# Patient Record
Sex: Female | Born: 1970 | Race: White | Hispanic: No | Marital: Single | Smoking: Never smoker
Health system: Southern US, Community
[De-identification: ages and names within clinical notes are randomized; demographics above are authoritative.]

## PROBLEM LIST (undated history)

## (undated) DIAGNOSIS — N200 Calculus of kidney: Secondary | ICD-10-CM

## (undated) DIAGNOSIS — E119 Type 2 diabetes mellitus without complications: Secondary | ICD-10-CM

## (undated) HISTORY — PX: TONSILLECTOMY: SUR1361

## (undated) HISTORY — DX: Type 2 diabetes mellitus without complications: E11.9

---

## 2012-08-23 ENCOUNTER — Emergency Department (HOSPITAL_BASED_OUTPATIENT_CLINIC_OR_DEPARTMENT_OTHER)
Admission: EM | Admit: 2012-08-23 | Discharge: 2012-08-23 | Disposition: A | Discharge status: Home / Self Care | Payer: Self-pay | Attending: Emergency Medicine | Admitting: Emergency Medicine

## 2012-08-23 ENCOUNTER — Encounter (HOSPITAL_BASED_OUTPATIENT_CLINIC_OR_DEPARTMENT_OTHER): Payer: Self-pay | Admitting: *Deleted

## 2012-08-23 ENCOUNTER — Emergency Department (HOSPITAL_BASED_OUTPATIENT_CLINIC_OR_DEPARTMENT_OTHER): Payer: Self-pay

## 2012-08-23 DIAGNOSIS — E119 Type 2 diabetes mellitus without complications: Secondary | ICD-10-CM | POA: Insufficient documentation

## 2012-08-23 DIAGNOSIS — R109 Unspecified abdominal pain: Secondary | ICD-10-CM | POA: Insufficient documentation

## 2012-08-23 DIAGNOSIS — N390 Urinary tract infection, site not specified: Secondary | ICD-10-CM | POA: Insufficient documentation

## 2012-08-23 DIAGNOSIS — N201 Calculus of ureter: Secondary | ICD-10-CM | POA: Insufficient documentation

## 2012-08-23 DIAGNOSIS — Z3202 Encounter for pregnancy test, result negative: Secondary | ICD-10-CM | POA: Insufficient documentation

## 2012-08-23 LAB — URINALYSIS, ROUTINE W REFLEX MICROSCOPIC
Ketones, ur: 40 mg/dL — AB
Nitrite: NEGATIVE
Urobilinogen, UA: 0.2 mg/dL (ref 0.0–1.0)
pH: 5 (ref 5.0–8.0)

## 2012-08-23 LAB — BASIC METABOLIC PANEL
Calcium: 9.4 mg/dL (ref 8.4–10.5)
Chloride: 96 mEq/L (ref 96–112)
Creatinine, Ser: 0.7 mg/dL (ref 0.50–1.10)
GFR calc Af Amer: >90 mL/min (ref >90)
GFR calc non Af Amer: >90 mL/min (ref >90)

## 2012-08-23 LAB — POCT I-STAT 3, VENOUS BLOOD GAS (G3P V)
Acid-base deficit: 4 mmol/L — ABNORMAL HIGH (ref 0.0–2.0)
pH, Ven: 7.373 — ABNORMAL HIGH (ref 7.250–7.300)

## 2012-08-23 LAB — URINE MICROSCOPIC-ADD ON

## 2012-08-23 LAB — GLUCOSE, CAPILLARY: Glucose-Capillary: 288 mg/dL — ABNORMAL HIGH (ref 70–99)

## 2012-08-23 MED ORDER — OXYCODONE-ACETAMINOPHEN 5-325 MG PO TABS: 2.0000 | ORAL_TABLET | ORAL | Status: DC | PRN

## 2012-08-23 MED ORDER — HYDROMORPHONE HCL PF 1 MG/ML IJ SOLN
1.0000 mg | Freq: Once | INTRAMUSCULAR | Status: AC
  Administered 2012-08-23: 1 mg via INTRAVENOUS
  Filled 2012-08-23: qty 1

## 2012-08-23 MED ORDER — ONDANSETRON HCL 4 MG/2ML IJ SOLN
4.0000 mg | Freq: Once | INTRAMUSCULAR | Status: AC
  Administered 2012-08-23: 4 mg via INTRAVENOUS
  Filled 2012-08-23: qty 2

## 2012-08-23 MED ORDER — CEPHALEXIN 500 MG PO CAPS: 500.0000 mg | ORAL_CAPSULE | Freq: Four times a day (QID) | ORAL | Status: DC

## 2012-08-23 MED ORDER — SODIUM CHLORIDE 0.9 % IV BOLUS (SEPSIS)
1000.0000 mL | Freq: Once | INTRAVENOUS | Status: AC
  Administered 2012-08-23: 1000 mL via INTRAVENOUS

## 2012-08-23 MED ORDER — MORPHINE SULFATE 4 MG/ML IJ SOLN
4.0000 mg | Freq: Once | INTRAMUSCULAR | Status: AC
  Administered 2012-08-23: 4 mg via INTRAVENOUS
  Filled 2012-08-23: qty 1

## 2012-08-23 MED ORDER — METFORMIN HCL 500 MG PO TABS: 500.0000 mg | ORAL_TABLET | Freq: Two times a day (BID) | ORAL | Status: DC

## 2012-08-23 NOTE — ED Notes (Signed)
Pt c/o right lower back pain x 4 days

## 2012-08-23 NOTE — ED Provider Notes (Signed)
History    CSN: [id number] Arrival date & time 08/23/12  1439  First MD Initiated Contact with Patient 08/23/12 1502     Chief Complaint  Patient presents with  . Back Pain   (Consider location/radiation/quality/duration/timing/severity/associated sxs/prior Treatment) HPI Comments: Pt c/o left flank paint that started a couple of days ago:pt states that ibuprofen was helping, but not nothing makes her comfort:pt denies vomiting, diarrhea or fever:no abdominal pain, dysuria or vaginal discharge:no injury:no history of stones or similar symptoms  The history is provided by the patient. No language interpreter was used.   History reviewed. No pertinent past medical history. Past Surgical History  Procedure Laterality Date  . Cesarean section    . Tonsillectomy     History reviewed. No pertinent family history. History  Substance Use Topics  . Smoking status: Never Smoker   . Smokeless tobacco: Not on file  . Alcohol Use: No   OB History   Grav Para Term Preterm Abortions TAB SAB Ect Mult Living                 Review of Systems  Constitutional: Negative.   Respiratory: Negative.   Cardiovascular: Negative.     Allergies  Review of patient's allergies indicates no known allergies.  Home Medications  No current outpatient prescriptions on file. BP 144/95  Pulse 100  Temp(Src) 98.8 F (37.1 C)  Resp 16  Ht 5\' 6"  (1.676 m)  Wt 134 lb (60.782 kg)  BMI 21.64 kg/m2  SpO2 100%  LMP 08/03/2012 Physical Exam  Nursing note and vitals reviewed. Constitutional: She is oriented to person, place, and time. She appears well-developed and well-nourished.  HENT:  Head: Normocephalic and atraumatic.  Cardiovascular: Normal rate and regular rhythm.   Pulmonary/Chest: Effort normal and breath sounds normal.  Abdominal: Soft. Bowel sounds are normal. There is no tenderness.  Musculoskeletal: Normal range of motion. She exhibits no tenderness.  Neurological: She is alert and  oriented to person, place, and time. Coordination normal.  Skin: Skin is warm and dry.  Psychiatric: She has a normal mood and affect.    ED Course  Procedures (including critical care time) Labs Reviewed  URINALYSIS, ROUTINE W REFLEX MICROSCOPIC - Abnormal; Notable for the following:    APPearance CLOUDY (*)    Specific Gravity, Urine 1.038 (*)    Glucose, UA >1000 (*)    Hgb urine dipstick MODERATE (*)    Ketones, ur 40 (*)    Leukocytes, UA MODERATE (*)    All other components within normal limits  URINE MICROSCOPIC-ADD ON - Abnormal; Notable for the following:    Squamous Epithelial / LPF FEW (*)    Bacteria, UA FEW (*)    All other components within normal limits  BASIC METABOLIC PANEL - Abnormal; Notable for the following:    Sodium 130 (*)    CO2 18 (*)    Glucose, Bld 336 (*)    All other components within normal limits  GLUCOSE, CAPILLARY - Abnormal; Notable for the following:    Glucose-Capillary 288 (*)    All other components within normal limits  POCT I-STAT 3, BLOOD GAS (G3P V) - Abnormal; Notable for the following:    pH, Ven 7.373 (*)    pCO2, Ven 36.0 (*)    pO2, Ven 54.0 (*)    Acid-base deficit 4.0 (*)    All other components within normal limits  URINE CULTURE  PREGNANCY, URINE  BLOOD GAS, VENOUS   No results  found. 1. UTI (lower urinary tract infection)   2. Ureteral stone   3. Diabetes     MDM  Pt is a newly diagnosed diabetic:pt is not in dka:pt to be sent home with metformin:pt has stone and uti:pt has not had fever:will sent urine culture:pt given oxycodone and keflex for home:pt is comfortable but requesting one more dose of pain medication before she goes  Teressa Lower, NP 08/23/12 1849

## 2012-08-23 NOTE — ED Notes (Signed)
2nd lab draw due to blood hemolyzed with IV stick-reports pain is no better

## 2012-08-24 LAB — URINE CULTURE: Colony Count: 45000

## 2012-08-24 NOTE — ED Provider Notes (Signed)
History/physical exam/procedure(s) were performed by non-physician practitioner and as supervising physician I was immediately available for consultation/collaboration. I have reviewed all notes and am in agreement with care and plan.   Adylynn Hertenstein S Chantavia Bazzle, MD 08/24/12 1245 

## 2012-08-25 ENCOUNTER — Emergency Department (HOSPITAL_BASED_OUTPATIENT_CLINIC_OR_DEPARTMENT_OTHER)
Admission: EM | Admit: 2012-08-25 | Discharge: 2012-08-25 | Disposition: A | Discharge status: Home / Self Care | Payer: Self-pay | Attending: Emergency Medicine | Admitting: Emergency Medicine

## 2012-08-25 ENCOUNTER — Telehealth (HOSPITAL_COMMUNITY): Payer: Self-pay | Admitting: Emergency Medicine

## 2012-08-25 ENCOUNTER — Encounter (HOSPITAL_BASED_OUTPATIENT_CLINIC_OR_DEPARTMENT_OTHER): Payer: Self-pay | Admitting: *Deleted

## 2012-08-25 DIAGNOSIS — Z79899 Other long term (current) drug therapy: Secondary | ICD-10-CM | POA: Insufficient documentation

## 2012-08-25 DIAGNOSIS — R112 Nausea with vomiting, unspecified: Secondary | ICD-10-CM | POA: Insufficient documentation

## 2012-08-25 DIAGNOSIS — E1169 Type 2 diabetes mellitus with other specified complication: Secondary | ICD-10-CM | POA: Insufficient documentation

## 2012-08-25 DIAGNOSIS — R109 Unspecified abdominal pain: Secondary | ICD-10-CM | POA: Insufficient documentation

## 2012-08-25 DIAGNOSIS — N23 Unspecified renal colic: Secondary | ICD-10-CM

## 2012-08-25 DIAGNOSIS — Z87442 Personal history of urinary calculi: Secondary | ICD-10-CM | POA: Insufficient documentation

## 2012-08-25 DIAGNOSIS — R1110 Vomiting, unspecified: Secondary | ICD-10-CM

## 2012-08-25 DIAGNOSIS — R739 Hyperglycemia, unspecified: Secondary | ICD-10-CM

## 2012-08-25 HISTORY — DX: Calculus of kidney: N20.0

## 2012-08-25 LAB — COMPREHENSIVE METABOLIC PANEL
ALT: 17 U/L (ref 0–35)
Albumin: 3.5 g/dL (ref 3.5–5.2)
Alkaline Phosphatase: 46 U/L (ref 39–117)
BUN: 14 mg/dL (ref 6–23)
Chloride: 94 mEq/L — ABNORMAL LOW (ref 96–112)
Glucose, Bld: 281 mg/dL — ABNORMAL HIGH (ref 70–99)
Potassium: 3.8 mEq/L (ref 3.5–5.1)
Sodium: 131 mEq/L — ABNORMAL LOW (ref 135–145)
Total Bilirubin: 0.4 mg/dL (ref 0.3–1.2)
Total Protein: 7.7 g/dL (ref 6.0–8.3)

## 2012-08-25 LAB — CBC WITH DIFFERENTIAL/PLATELET
Basophils Relative: 0 % (ref 0–1)
Hemoglobin: 14.2 g/dL (ref 12.0–15.0)
Lymphs Abs: 1.7 10*3/uL (ref 0.7–4.0)
Monocytes Relative: 6 % (ref 3–12)
Neutro Abs: 7.6 10*3/uL (ref 1.7–7.7)
Neutrophils Relative %: 76 % (ref 43–77)
Platelets: 203 10*3/uL (ref 150–400)
RBC: 4.69 MIL/uL (ref 3.87–5.11)
WBC: 10.1 10*3/uL (ref 4.0–10.5)

## 2012-08-25 LAB — GLUCOSE, CAPILLARY: Glucose-Capillary: 244 mg/dL — ABNORMAL HIGH (ref 70–99)

## 2012-08-25 LAB — URINALYSIS, ROUTINE W REFLEX MICROSCOPIC
Bilirubin Urine: NEGATIVE
Hgb urine dipstick: NEGATIVE
Ketones, ur: >80 mg/dL — AB
Nitrite: NEGATIVE
Specific Gravity, Urine: 1.041 — ABNORMAL HIGH (ref 1.005–1.030)
pH: 5.5 (ref 5.0–8.0)

## 2012-08-25 LAB — LIPASE, BLOOD: Lipase: 35 U/L (ref 11–59)

## 2012-08-25 LAB — URINE MICROSCOPIC-ADD ON

## 2012-08-25 MED ORDER — METOCLOPRAMIDE HCL 5 MG/ML IJ SOLN
10.0000 mg | Freq: Once | INTRAMUSCULAR | Status: AC
  Administered 2012-08-25: 10 mg via INTRAVENOUS
  Filled 2012-08-25: qty 2

## 2012-08-25 MED ORDER — HYDROMORPHONE HCL PF 1 MG/ML IJ SOLN
1.0000 mg | Freq: Once | INTRAMUSCULAR | Status: AC
  Administered 2012-08-25: 1 mg via INTRAVENOUS
  Filled 2012-08-25: qty 1

## 2012-08-25 MED ORDER — ONDANSETRON HCL 4 MG/2ML IJ SOLN
4.0000 mg | Freq: Once | INTRAMUSCULAR | Status: AC
  Administered 2012-08-25: 4 mg via INTRAVENOUS
  Filled 2012-08-25: qty 2

## 2012-08-25 MED ORDER — OXYCODONE-ACETAMINOPHEN 5-325 MG PO TABS: 2.0000 | ORAL_TABLET | Freq: Four times a day (QID) | ORAL | Status: DC | PRN

## 2012-08-25 MED ORDER — HYDROMORPHONE HCL PF 1 MG/ML IJ SOLN
1.0000 mg | Freq: Once | INTRAMUSCULAR | Status: AC
Start: 2012-08-25 — End: 2012-08-25
  Administered 2012-08-25: 1 mg via INTRAVENOUS
  Filled 2012-08-25: qty 1

## 2012-08-25 MED ORDER — ONDANSETRON 8 MG PO TBDP: ORAL_TABLET | ORAL | Status: DC

## 2012-08-25 MED ORDER — SODIUM CHLORIDE 0.9 % IV BOLUS (SEPSIS)
2000.0000 mL | Freq: Once | INTRAVENOUS | Status: AC
  Administered 2012-08-25: 2000 mL via INTRAVENOUS

## 2012-08-25 MED ORDER — METOCLOPRAMIDE HCL 10 MG PO TABS: 10.0000 mg | ORAL_TABLET | Freq: Four times a day (QID) | ORAL | Status: DC | PRN

## 2012-08-25 NOTE — ED Notes (Signed)
The patient's CBG was 244mg /dL

## 2012-08-25 NOTE — ED Notes (Signed)
Patient here with c/o nausea and vomiting since 11pm.  Woke up around 10 am and vomited about 5 times PTA.  Patient was seen here on Friday and was diagnosed with kidney infection and UTI.

## 2012-08-25 NOTE — ED Provider Notes (Signed)
History    CSN: [id number] Arrival date & time 08/25/12  1205  First MD Initiated Contact with Patient 08/25/12 1327     Chief Complaint  Patient presents with  . Emesis   (Consider location/radiation/quality/duration/timing/severity/associated sxs/prior Treatment) HPI This 42 year old female has several days of waxing and waning right flank pain with nausea who presented to the emergency room to 2 days ago with worsening flank pain and diagnosed with kidney stone 5 mm at the UVJ as well as possible urine infection and placed on antibiotics urine culture showed polymicrobial no specific organism not convincing for urinary infection incidentally the patient had fatty liver and gallstones as well and her CT scan she also had an elevated blood sugar diagnosis new-onset diabetes and has taken 2 diabetes medicine tablets over the last couple days but is not lightheaded check blood sugars yet now returns to the emergency room and do to new-onset vomiting last night once in a few episodes today nonbloody with baseline right flank pain mild to moderate with no fever no altered mental status no headache no stiff neck no chest pain cough shortness breath no abdominal pain no diarrhea or bloody stools no dysuria and she had negative pregnancy test 2 days ago here in the ED. She has had no polyuria no polydipsia. Past Medical History  Diagnosis Date  . Kidney stone    Past Surgical History  Procedure Laterality Date  . Cesarean section    . Tonsillectomy     History reviewed. No pertinent family history. History  Substance Use Topics  . Smoking status: Never Smoker   . Smokeless tobacco: Not on file  . Alcohol Use: No   OB History   Grav Para Term Preterm Abortions TAB SAB Ect Mult Living                 Review of Systems 10 Systems reviewed and are negative for acute change except as noted in the HPI. Allergies  Review of patient's allergies indicates no known allergies.  Home  Medications   Current Outpatient Rx  Name  Route  Sig  Dispense  Refill  . cephALEXin (KEFLEX) 500 MG capsule   Oral   Take 1 capsule (500 mg total) by mouth 4 (four) times daily.   28 capsule   0   . metFORMIN (GLUCOPHAGE) 500 MG tablet   Oral   Take 1 tablet (500 mg total) by mouth 2 (two) times daily with a meal.   60 tablet   0   . metoCLOPramide (REGLAN) 10 MG tablet   Oral   Take 1 tablet (10 mg total) by mouth every 6 (six) hours as needed (nausea/headache).   10 tablet   0   . ondansetron (ZOFRAN ODT) 8 MG disintegrating tablet      8mg  ODT q4 hours prn nausea   3 tablet   0   . oxyCODONE-acetaminophen (PERCOCET) 5-325 MG per tablet   Oral   Take 2 tablets by mouth every 6 (six) hours as needed for pain.   15 tablet   0   . oxyCODONE-acetaminophen (PERCOCET/ROXICET) 5-325 MG per tablet   Oral   Take 2 tablets by mouth every 4 (four) hours as needed for pain.   20 tablet   0    BP 126/78  Pulse 87  Temp(Src) 98.4 F (36.9 C) (Oral)  Resp 20  SpO2 97%  LMP 08/03/2012 Physical Exam  Nursing note and vitals reviewed. Constitutional:  Awake, alert, nontoxic  appearance.  HENT:  Head: Atraumatic.  Mouth/Throat: Oropharynx is clear and moist.  Eyes: Right eye exhibits no discharge. Left eye exhibits no discharge.  Neck: Neck supple.  Cardiovascular: Normal rate and regular rhythm.   No murmur heard. Pulmonary/Chest: Effort normal and breath sounds normal. No respiratory distress. She has no wheezes. She has no rales. She exhibits no tenderness.  Abdominal: Soft. Bowel sounds are normal. She exhibits no distension and no mass. There is no tenderness. There is no rebound and no guarding.  Genitourinary:  No CVA tenderness no back tenderness  Musculoskeletal: She exhibits no tenderness.  Baseline ROM, no obvious new focal weakness.  Neurological: She is alert.  Mental status and motor strength appears baseline for patient and situation.  Skin: No rash  noted.  Psychiatric: She has a normal mood and affect.    ED Course  Procedures (including critical care time) Pt feels improved after observation and/or treatment in ED, pain and vomiting controlled. Labs Reviewed  COMPREHENSIVE METABOLIC PANEL - Abnormal; Notable for the following:    Sodium 131 (*)    Chloride 94 (*)    Glucose, Bld 281 (*)    All other components within normal limits  URINALYSIS, ROUTINE W REFLEX MICROSCOPIC - Abnormal; Notable for the following:    Specific Gravity, Urine 1.041 (*)    Glucose, UA >1000 (*)    Ketones, ur >80 (*)    All other components within normal limits  GLUCOSE, CAPILLARY - Abnormal; Notable for the following:    Glucose-Capillary 244 (*)    All other components within normal limits  URINE MICROSCOPIC-ADD ON - Abnormal; Notable for the following:    Squamous Epithelial / LPF FEW (*)    All other components within normal limits  CBC WITH DIFFERENTIAL  LIPASE, BLOOD  KETONES, QUALITATIVE   No results found. 1. Vomiting   2. Hyperglycemia   3. Renal colic     MDM  I doubt any other EMC precluding discharge at this time including, but not necessarily limited to the following:sepsis, DKA.  Hurman Horn, MD 08/25/12 2219

## 2012-08-28 ENCOUNTER — Ambulatory Visit: Payer: Self-pay | Attending: Family Medicine | Admitting: Internal Medicine

## 2012-08-28 VITALS — BP 137/89 | HR 82 | Temp 98.3°F | Resp 16 | Ht 66.0 in | Wt 234.4 lb

## 2012-08-28 DIAGNOSIS — Z09 Encounter for follow-up examination after completed treatment for conditions other than malignant neoplasm: Secondary | ICD-10-CM | POA: Insufficient documentation

## 2012-08-28 DIAGNOSIS — E119 Type 2 diabetes mellitus without complications: Secondary | ICD-10-CM

## 2012-08-28 NOTE — Progress Notes (Signed)
Patient ID: Erin Hawkins, female   DOB: February 23, 1971, 42 y.o.   MRN: [id number]  CC: Followup  HPI: Patient is 42 year old female who comes in for blood sugar checks. She was told she is diabetic and has no history of diabetes. She has history of gestational diabetes but that has been resolved. She denies chest pain or shortness of breath no recent sicknesses or hospitalizations, no urinary or abdominal concerns.  No Known Allergies Past Medical History  Diagnosis Date  . Kidney stone    Current Outpatient Prescriptions on File Prior to Visit  Medication Sig Dispense Refill  . metFORMIN (GLUCOPHAGE) 500 MG tablet Take 1 tablet (500 mg total) by mouth 2 (two) times daily with a meal.  60 tablet  0  . metoCLOPramide (REGLAN) 10 MG tablet Take 1 tablet (10 mg total) by mouth every 6 (six) hours as needed (nausea/headache).  10 tablet  0  . ondansetron (ZOFRAN ODT) 8 MG disintegrating tablet 8mg  ODT q4 hours prn nausea  3 tablet  0  . oxyCODONE-acetaminophen (PERCOCET) 5-325 MG per tablet Take 2 tablets by mouth every 6 (six) hours as needed for pain.  15 tablet  0  . oxyCODONE-acetaminophen (PERCOCET/ROXICET) 5-325 MG per tablet Take 2 tablets by mouth every 4 (four) hours as needed for pain.  20 tablet  0   No current facility-administered medications on file prior to visit.   History reviewed. No pertinent family history. History   Social History  . Marital Status: Single    Spouse Name: N/A    Number of Children: N/A  . Years of Education: N/A   Occupational History  . Not on file.   Social History Main Topics  . Smoking status: Never Smoker   . Smokeless tobacco: Not on file  . Alcohol Use: No  . Drug Use: No  . Sexually Active: No   Other Topics Concern  . Not on file   Social History Narrative  . No narrative on file    Review of Systems  Constitutional: Negative for fever, chills, diaphoresis, activity change, appetite change and fatigue.  HENT: Negative for ear pain,  nosebleeds, congestion, facial swelling, rhinorrhea, neck pain, neck stiffness and ear discharge.   Eyes: Negative for pain, discharge, redness, itching and visual disturbance.  Respiratory: Negative for cough, choking, chest tightness, shortness of breath, wheezing and stridor.   Cardiovascular: Negative for chest pain, palpitations and leg swelling.  Gastrointestinal: Negative for abdominal distention.  Genitourinary: Negative for dysuria, urgency, frequency, hematuria, flank pain, decreased urine volume, difficulty urinating and dyspareunia.  Musculoskeletal: Negative for back pain, joint swelling, arthralgias and gait problem.  Neurological: Negative for dizziness, tremors, seizures, syncope, facial asymmetry, speech difficulty, weakness, light-headedness, numbness and headaches.  Hematological: Negative for adenopathy. Does not bruise/bleed easily.  Psychiatric/Behavioral: Negative for hallucinations, behavioral problems, confusion, dysphoric mood, decreased concentration and agitation.    Objective:   Filed Vitals:   08/28/12 1328  BP: 137/89  Pulse: 82  Temp: 98.3 F (36.8 C)  Resp: 16    Physical Exam  Constitutional: Appears well-developed and well-nourished. No distress.  CVS: RRR, S1/S2 +, no murmurs, no gallops, no carotid bruit.  Pulmonary: Effort and breath sounds normal, no stridor, rhonchi, wheezes, rales.  Abdominal: Soft. BS +,  no distension, tenderness, rebound or guarding.  Skin: Skin is warm and dry. No rash noted. Not diaphoretic. No erythema. No pallor.  Psychiatric: Normal mood and affect. Behavior, judgment, thought content normal.   Lab Results  Component  Value Date   WBC 10.1 08/25/2012   HGB 14.2 08/25/2012   HCT 41.4 08/25/2012   MCV 88.3 08/25/2012   PLT 203 08/25/2012   Lab Results  Component Value Date   CREATININE 0.60 08/25/2012   BUN 14 08/25/2012   NA 131* 08/25/2012   K 3.8 08/25/2012   CL 94* 08/25/2012   CO2 24 08/25/2012    No results  found for this basename: HGBA1C   Lipid Panel  No results found for this basename: chol, trig, hdl, cholhdl, vldl, ldlcalc       Assessment and plan:   Questionable diabetes - appears to be diabetic based on sugar levels on electrolyte panel. We'll check A1c today. I advised patient to continue taking metformin 500 mg tablet twice daily. We will notify her of A1c results and let her know if she needs further readjustment in the medication. We have discussed exercise and diet recommendations along with regular foot checks. Patient verbalized understanding

## 2012-08-28 NOTE — Patient Instructions (Addendum)

## 2012-08-28 NOTE — Progress Notes (Signed)
Patient is a follow up from hospital Kidney stones

## 2012-10-02 ENCOUNTER — Encounter: Payer: Self-pay | Admitting: Internal Medicine

## 2012-10-02 ENCOUNTER — Ambulatory Visit: Payer: Self-pay | Attending: Family Medicine | Admitting: Internal Medicine

## 2012-10-02 VITALS — BP 115/80 | HR 103 | Temp 98.9°F | Resp 18 | Ht 64.0 in | Wt 229.6 lb

## 2012-10-02 DIAGNOSIS — J209 Acute bronchitis, unspecified: Secondary | ICD-10-CM | POA: Insufficient documentation

## 2012-10-02 DIAGNOSIS — E119 Type 2 diabetes mellitus without complications: Secondary | ICD-10-CM | POA: Insufficient documentation

## 2012-10-02 DIAGNOSIS — E669 Obesity, unspecified: Secondary | ICD-10-CM | POA: Insufficient documentation

## 2012-10-02 MED ORDER — FREESTYLE SYSTEM KIT: 1.0000 | PACK | Status: AC | PRN

## 2012-10-02 MED ORDER — METFORMIN HCL 850 MG PO TABS: 850.0000 mg | ORAL_TABLET | Freq: Two times a day (BID) | ORAL | Status: DC

## 2012-10-02 MED ORDER — GUAIFENESIN ER 600 MG PO TB12: 600.0000 mg | ORAL_TABLET | Freq: Two times a day (BID) | ORAL | Status: DC

## 2012-10-02 MED ORDER — AMOXICILLIN-POT CLAVULANATE 875-125 MG PO TABS: 1.0000 | ORAL_TABLET | Freq: Two times a day (BID) | ORAL | Status: DC

## 2012-10-02 MED ORDER — LEVALBUTEROL TARTRATE 45 MCG/ACT IN AERO: 1.0000 | INHALATION_SPRAY | Freq: Three times a day (TID) | RESPIRATORY_TRACT | Status: DC | PRN

## 2012-10-02 NOTE — Progress Notes (Signed)
10/02/12 Present for follow up with diabetes and coughing P[facility] BSN [facility]

## 2012-10-02 NOTE — Progress Notes (Signed)
Patient ID: Erin Hawkins, female   DOB: 06-09-1970, 42 y.o.   MRN: [id number] Patient Demographics  Erin Hawkins, is a 42 y.o. female  ION:[id number]  [id number]  DOB - [birth year]  No chief complaint on file.       Subjective:   Erin Hawkins today is here for a follow up visit.  Patient has No headache, No chest pain, No abdominal pain - No Nausea, No new weakness tingling or numbness, doesnot check her blood sugars, spot CBG 171 and she is fasting (didnot get time to eat) Also has cough x 2 weeks with occasional wheezing at night, post nasal drip and congestion.  Objective:    Filed Vitals:   10/02/12 1152  BP: 115/80  Pulse: 103  Temp: 98.9 F (37.2 C)  TempSrc: Oral  Resp: 18  Height: 5\' 4"  (1.626 m)  Weight: 229 lb 9.6 oz (104.146 kg)  SpO2: 97%     ALLERGIES:  No Known Allergies  PAST MEDICAL HISTORY: Past Medical History  Diagnosis Date  . Kidney stone   . Diabetes mellitus without complication     MEDICATIONS AT HOME: Prior to Admission medications   Medication Sig Start Date End Date Taking? Authorizing Provider  amoxicillin-clavulanate (AUGMENTIN) 875-125 MG per tablet Take 1 tablet by mouth 2 (two) times daily. 10/02/12   Maksymilian Mabey Jenna Luo, MD  glucose monitoring kit (FREESTYLE) monitoring kit 1 each by Does not apply route as needed for other. 10/02/12   Jalia Zuniga Jenna Luo, MD  guaiFENesin (MUCINEX) 600 MG 12 hr tablet Take 1 tablet (600 mg total) by mouth 2 (two) times daily. Also available OTC 10/02/12   Kelsay Haggard Jenna Luo, MD  metFORMIN (GLUCOPHAGE) 850 MG tablet Take 1 tablet (850 mg total) by mouth 2 (two) times daily with a meal. 10/02/12   Wadsworth Skolnick Jenna Luo, MD     Exam  General appearance :Awake, alert, NAD, Speech Clear.  HEENT: Atraumatic and Normocephalic, PERLA Neck: supple, no JVD. No cervical lymphadenopathy.  Chest: scattered wheezing noticed on coughing CVS: S1 S2 regular, no murmurs.  Abdomen:obese soft, NBS, NT, ND, no gaurding, rigidity or  rebound. Extremities: no cyanosis or clubbing, B/L Lower Ext shows no edema Neurology: Awake alert, and oriented X 3, CN II-XII intact, Non focal Skin: No Rash or lesions Wounds:N/A    Data Review   Basic Metabolic Panel: No results found for this basename: NA, K, CL, CO2, GLUCOSE, BUN, CREATININE, CALCIUM, MG, PHOS,  in the last 168 hours Liver Function Tests: No results found for this basename: AST, ALT, ALKPHOS, BILITOT, PROT, ALBUMIN,  in the last 168 hours  CBC: No results found for this basename: WBC, NEUTROABS, HGB, HCT, MCV, PLT,  in the last 168 hours  ------------------------------------------------------------------------------------------------------------------ No results found for this basename: HGBA1C,  in the last 72 hours ------------------------------------------------------------------------------------------------------------------ No results found for this basename: CHOL, HDL, LDLCALC, TRIG, CHOLHDL, LDLDIRECT,  in the last 72 hours ------------------------------------------------------------------------------------------------------------------ No results found for this basename: TSH, T4TOTAL, FREET3, T3FREE, THYROIDAB,  in the last 72 hours ------------------------------------------------------------------------------------------------------------------ No results found for this basename: VITAMINB12, FOLATE, FERRITIN, TIBC, IRON, RETICCTPCT,  in the last 72 hours  Coagulation profile  No results found for this basename: INR, PROTIME,  in the last 168 hours    Assessment & Plan   Active Problems: Diabetes Mellitus: - spot CBG 171, fasting - will increase metformin to 850mg  BID, gave prescription for glucomete - patient educated to check BS twice a day, keep a log and bring to  next appt - opthal exam in last 3 months per patient, was normal - last UA in 6/14 showed no protein uria - will check HbA1C next week - ambulatory referral to diabetic education   - counseled patient to lose weight, exercise, and gave diet education  2. Acute bronchitis:  - placed on augmentin x 10 days, mucinex - Xopenex inhaler as needed  Follow-up in 2months     Rondy Krupinski M.D. 10/02/2012, 12:29 PM

## 2012-10-31 ENCOUNTER — Encounter: Payer: Self-pay | Attending: Internal Medicine | Admitting: *Deleted

## 2012-10-31 ENCOUNTER — Encounter: Payer: Self-pay | Admitting: *Deleted

## 2012-10-31 VITALS — Ht 61.0 in | Wt 228.6 lb

## 2012-10-31 DIAGNOSIS — E119 Type 2 diabetes mellitus without complications: Secondary | ICD-10-CM | POA: Insufficient documentation

## 2012-10-31 DIAGNOSIS — Z713 Dietary counseling and surveillance: Secondary | ICD-10-CM | POA: Insufficient documentation

## 2012-10-31 NOTE — Progress Notes (Signed)
Appt start time: 0945 end time:  1115.   Assessment:  Patient was seen on  10/31/12 for individual Gestational diabetes education. She presents with her adoptive mother. She is a single mother of one daughter and did not have GDM during that pregnancy. On 08/25/12 she presented to the emergency department with pain, nausea and vomitting. On 08-28-12 she was seen by primary care with an A1c of 9.1% and was placed on Metformin. The metformin dose has since been increased to 850mg  BID. She finds that her eating behavior is challanged as she stays awake most of the night caring for her grandmother.  Current HbA1c: 9.1%  MEDICATIONS: Metformin 850mg  BID   DIETARY INTAKE:  Usual eating pattern includes 2 meals and 3 snacks per day.   Everyday foods include wide variety of proteins and vegetables.  Avoided foods include.  "Like variety"  24-hr recall:  B ( AM): potatoe, onions, pepper, sausage, egg fried in pan / poached egg on toast/ french toast Snk ( AM): peanuts, avacado, tomato, (olive oil) tortilla chips  L ( PM): subway BLT, tuna fish salade with olives Snk ( PM): peanuts D ( PM): meat, vegetable, baked potato, peas, butter beans Snk ( PM): pumpkin seeds Beverages: flavored water, skim milk   Usual physical activity: none (has [employer] but does not attend)    Nutritional Diagnosis:  NB-1.1 Food and nutrition-related knowledge deficit As related to elevated glucose.  As evidenced by A1c 9.1%.    Intervention:  Nutrition counseling provided.  Discussed diabetes disease process and treatment options.  Discussed physiology of diabetes.  Discussed role of medications and diet in glucose control  Provided education on macronutrients on glucose levels.  Provided education on carb counting, importance of regularly scheduled meals/snacks, and meal planning  Reviewed patient medications.  Discussed role of medication on blood glucose and possible side effects  Discussed blood glucose  monitoring and interpretation.  Discussed recommended target ranges and individual ranges.    Described short-term complications: hyper- and hypo-glycemia.  Discussed causes,symptoms, and treatment options.  Discussed role of stress on blood glucose levels and discussed strategies to manage psychosocial issues.  Handouts given during visit include: GDM curriculum outline Carb Counting  Meal Plan Card  Meal work sheet  Plan:  Aid for 2 carb choices for breakfast (30 grams)  Aim for 3 Carb Choices per lunch and dinner (45 grams) +/- 1 either way  Aim for 0-2 Carbs per snack if hungry  Consider reading food labels for Total Carbohydrate and Fat Grams of foods Consider checking BG four times per day FBS, 2hpp breakfast, lunch and dinner Consider taking medication as directed by MD  Barriers to learning/adherance to lifestyle change: motivation  Diabetes self-care support plan:   [facility] support group  family  Monitoring/Evaluation:  Dietary intake, exercise, glucose testing, and body weight return to [facility] prn.

## 2012-11-04 ENCOUNTER — Encounter: Payer: Self-pay | Admitting: *Deleted

## 2012-11-04 NOTE — Patient Instructions (Signed)
Plan:  Aid for 2 carb choices for breakfast (30 grams)  Aim for 3 Carb Choices per lunch and dinner (45 grams) +/- 1 either way  Aim for 0-2 Carbs per snack if hungry  Consider reading food labels for Total Carbohydrate and Fat Grams of foods Consider checking BG four times per day FBS, 2hpp breakfast, lunch and dinner Consider taking medication as directed by MD

## 2012-12-02 ENCOUNTER — Ambulatory Visit: Payer: Self-pay | Attending: Internal Medicine

## 2012-12-02 ENCOUNTER — Encounter: Payer: Self-pay | Admitting: Internal Medicine

## 2012-12-02 ENCOUNTER — Ambulatory Visit (HOSPITAL_BASED_OUTPATIENT_CLINIC_OR_DEPARTMENT_OTHER): Payer: Self-pay | Admitting: Internal Medicine

## 2012-12-02 VITALS — BP 103/68 | HR 90 | Temp 98.3°F | Resp 18 | Wt 224.0 lb

## 2012-12-02 DIAGNOSIS — E119 Type 2 diabetes mellitus without complications: Secondary | ICD-10-CM

## 2012-12-02 DIAGNOSIS — J209 Acute bronchitis, unspecified: Secondary | ICD-10-CM

## 2012-12-02 DIAGNOSIS — E669 Obesity, unspecified: Secondary | ICD-10-CM

## 2012-12-02 LAB — GLUCOSE, POCT (MANUAL RESULT ENTRY): POC Glucose: 151 mg/dl — AB (ref 70–99)

## 2012-12-02 MED ORDER — METFORMIN HCL 850 MG PO TABS: 850.0000 mg | ORAL_TABLET | Freq: Two times a day (BID) | ORAL | Status: DC

## 2012-12-02 NOTE — Progress Notes (Signed)
Patient ID: Erin Hawkins, female   DOB: 1970/09/24, 42 y.o.   MRN: [id number]  Patient Demographics  Erin Hawkins, is a 42 y.o. female  [id number]  [id number]  DOB - [birth year]  Chief Complaint  Patient presents with  . Follow-up        Subjective:   Erin Hawkins with History of morbid obesity, type 2 diabetes mellitus here for routine followup visit. She has no subjective complaints whatsoever. She's been checking her Accu-Cheks twice a day and maintaining a logbook, Accu-Cheks have been between 130 and 170.  Denies any subjective complaints except as above, no active headache, no chest abdominal pain at this time, not short of breath. No focal weakness which is new.    Objective:    Patient Active Problem List   Diagnosis Date Noted  . Type II type diabetes mellitus  10/02/2012  . Obesity, unspecified 10/02/2012     Filed Vitals:   12/02/12 0922  BP: 103/68  Pulse: 90  Temp: 98.3 F (36.8 C)  TempSrc: Oral  Resp: 18  Weight: 224 lb (101.606 kg)  SpO2: 99%     Exam   Awake Alert, Oriented X 3, No new F.N deficits, Normal affect [place].AT,PERRAL Supple Neck,No JVD, No cervical lymphadenopathy appriciated.  Symmetrical Chest wall movement, Good air movement bilaterally, CTAB RRR,No Gallops,Rubs or new Murmurs, No Parasternal Heave +ve B.Sounds, Abd Soft, Non tender, No organomegaly appriciated, No rebound - guarding or rigidity. No Cyanosis, Clubbing or edema, No new Rash or bruise       Data Review   CBC No results found for this basename: WBC, HGB, HCT, PLT, MCV, MCH, MCHC, RDW, NEUTRABS, LYMPHSABS, MONOABS, EOSABS, BASOSABS, BANDABS, BANDSABD,  in the last 168 hours  Chemistries   No results found for this basename: NA, K, CL, CO2, GLUCOSE, BUN, CREATININE, GFRCGP, CALCIUM, MG, AST, ALT, ALKPHOS, BILITOT,  in the last 168 hours ------------------------------------------------------------------------------------------------------------------ No results  found for this basename: HGBA1C,  in the last 72 hours ------------------------------------------------------------------------------------------------------------------ No results found for this basename: CHOL, HDL, LDLCALC, TRIG, CHOLHDL, LDLDIRECT,  in the last 72 hours ------------------------------------------------------------------------------------------------------------------ No results found for this basename: TSH, T4TOTAL, FREET3, T3FREE, THYROIDAB,  in the last 72 hours ------------------------------------------------------------------------------------------------------------------ No results found for this basename: VITAMINB12, FOLATE, FERRITIN, TIBC, IRON, RETICCTPCT,  in the last 72 hours  Coagulation profile  No results found for this basename: INR, PROTIME,  in the last 168 hours     Prior to Admission medications   Medication Sig Start Date End Date Taking? Authorizing Provider  glucose monitoring kit (FREESTYLE) monitoring kit 1 each by Does not apply route as needed for other. 10/02/12  Yes Ripudeep Jenna Luo, MD  levalbuterol (XOPENEX HFA) 45 MCG/ACT inhaler Inhale 1 puff into the lungs every 8 (eight) hours as needed for wheezing or shortness of breath. 10/02/12  Yes Ripudeep Jenna Luo, MD  metFORMIN (GLUCOPHAGE) 850 MG tablet Take 1 tablet (850 mg total) by mouth 2 (two) times daily with a meal. 10/02/12  Yes Ripudeep Jenna Luo, MD     Assessment & Plan    Diabetes mellitus type 2. Control is acceptable, her CBG in the office was 150 an A1c was 6. Her present dose Glucophage will be continued, she is maintaining an Accu-Chek log book and she was encouraged to continue doing so. Her recent eye exam was unremarkable. Her foot exam is unremarkable. Blood pressure is acceptable.  She will come back in 3 months to get a repeat A1c and to  get a urine microalbumin checked.   Obesity. Counseled on diet and exercise. Diabetic nutritionist consult will be made.    Routine health  maintenance.   OB referral made for mammogram and Pap smear  He should refused flu shot. She is up-to-date on tetanus.   Leroy Sea M.D on 12/02/2012 at 9:28 AM

## 2012-12-02 NOTE — Progress Notes (Signed)
Pt is here for a f/u on [facility] no new concerns Alert w/no signs of acute distress.  

## 2012-12-05 ENCOUNTER — Encounter: Payer: Self-pay | Attending: Internal Medicine | Admitting: *Deleted

## 2012-12-05 VITALS — Ht 61.0 in | Wt 224.9 lb

## 2012-12-05 DIAGNOSIS — Z713 Dietary counseling and surveillance: Secondary | ICD-10-CM | POA: Insufficient documentation

## 2012-12-05 DIAGNOSIS — E119 Type 2 diabetes mellitus without complications: Secondary | ICD-10-CM | POA: Insufficient documentation

## 2012-12-05 NOTE — Progress Notes (Deleted)
Subjective:     Patient ID: Erin Hawkins, female   DOB: [birth year], 42 y.o.   MRN: [id number]  HPI   Review of Systems     Objective:   Physical Exam     Assessment:     ***    Plan:     ***

## 2012-12-05 NOTE — Progress Notes (Signed)
Appt start time: 0900 end time:  1000.  Assessment:  Patient was seen on  12/05/12 for individual diabetes education. Patient comes in with her mother because she is noticing a significant difference in her glucose readings between different meters. They range between 142mg /dl to 171mg /dl with the same blood specimen. She will now utilize an [employer]. . Her physician recently reviewed her readings and was alarmed at their value. However, with a current A1c of 6.0% no medication change was made.  Amendment: Previous visit It was noted that patient had GDM. Patient is NOT pregnant.   A1c: 6.0%  Intervention:  Nutrition counseling provided.  Discussed diabetes disease process and treatment options.  Discussed physiology of diabetes and role of obesity on insulin resistance.  Encouraged moderate weight reduction to improve glucose levels.  Discussed role of medications and diet in glucose control  Provided education on macronutrients on glucose levels.  Provided education on carb counting, importance of regularly scheduled meals/snacks, and meal planning  Discussed effects of physical activity on glucose levels and long-term glucose control.  Recommended 150 minutes of physical activity/week.  Reviewed patient medications.  Discussed role of medication on blood glucose and possible side effects  Discussed blood glucose monitoring and interpretation.  Discussed recommended target ranges and individual ranges.    PLAN: She will continue to monitor 1X daily at varying times of day to include FBS and 2hpp Email me in one week with new glucose readings  Monitoring/Evaluation:  Dietary intake, exercise, Glucose monitoring, and body weight return to [facility] in 6 week(s).

## 2013-01-16 ENCOUNTER — Encounter: Payer: Self-pay | Admitting: *Deleted

## 2013-01-16 ENCOUNTER — Other Ambulatory Visit: Payer: Self-pay | Admitting: Emergency Medicine

## 2013-01-16 ENCOUNTER — Encounter: Payer: Self-pay | Attending: Internal Medicine | Admitting: *Deleted

## 2013-01-16 VITALS — Ht 61.0 in | Wt 222.1 lb

## 2013-01-16 DIAGNOSIS — E119 Type 2 diabetes mellitus without complications: Secondary | ICD-10-CM | POA: Insufficient documentation

## 2013-01-16 DIAGNOSIS — Z713 Dietary counseling and surveillance: Secondary | ICD-10-CM | POA: Insufficient documentation

## 2013-01-16 MED ORDER — METFORMIN HCL 850 MG PO TABS: 850.0000 mg | ORAL_TABLET | Freq: Two times a day (BID) | ORAL | Status: DC

## 2013-01-16 NOTE — Progress Notes (Signed)
Appt start time: 0900 end time:  1000.  Assessment:  Patient was seen on  01/16/13 for individual diabetes education. Tamella returns for follow up of T2DM along with her T2DM mother. She has been accompanying her mother to [facility]. Jordon used to skip meals, is now trying to eat more regularly.  She is making better choices and decreasing portion control. She has been trying recipes with alternative ingrediates to make healthier.  Glucerna had been suggested by the [facility] dietitian. In review of Tameaka's  glucose readings, Fasting range for 14 days 114-164 with a 14 day average of 137mg /dl. 14 day 2hpp  122mg /dl. Wt loss of 14.5# since September.  [employer].  [id number] Exp: [id number]  Current HbA1c: New one due in January  Preferred Learning Style:   No preference indicated   Learning Readiness:   Change in progress  MEDICATIONS: Metformin 850mg  BID  DIETARY INTAKE: No longer drinking any type of soda  Usual physical activity: None, not interested in exercise. Discussed alternative exercise options   Intervention:  Nutrition counseling provided.  Discussed diabetes disease process and treatment options.  Discussed physiology of diabetes and role of obesity on insulin resistance.  Encouraged moderate weight reduction to improve glucose levels.  Discussed role of medications and diet in glucose control  Provided education on macronutrients on glucose levels.  Provided education on carb counting, importance of regularly scheduled meals/snacks, and meal planning  Discussed effects of physical activity on glucose levels and long-term glucose control.  Recommended 150 minutes of physical activity/week.  Reviewed patient medications.  Discussed role of medication on blood glucose and possible side effects  Discussed blood glucose monitoring and interpretation.  Discussed recommended target ranges and individual ranges.     Teaching Method Utilized:  Auditory  Barriers to learning/adherence to lifestyle change: none  Diabetes self-care support plan:   [facility] support group  family  Demonstrated degree of understanding via:  Teach Back   Monitoring/Evaluation:  Dietary intake, exercise, Glucose monitoring, and body weight return prn.

## 2013-01-22 ENCOUNTER — Encounter: Payer: Self-pay | Admitting: Obstetrics and Gynecology

## 2013-03-04 ENCOUNTER — Encounter: Payer: Self-pay | Admitting: Internal Medicine

## 2013-03-04 ENCOUNTER — Ambulatory Visit: Payer: Self-pay | Attending: Internal Medicine | Admitting: Internal Medicine

## 2013-03-04 VITALS — BP 120/81 | HR 102 | Temp 98.7°F | Resp 14 | Ht 63.0 in | Wt 222.6 lb

## 2013-03-04 DIAGNOSIS — E119 Type 2 diabetes mellitus without complications: Secondary | ICD-10-CM | POA: Insufficient documentation

## 2013-03-04 LAB — LIPID PANEL
Cholesterol: 293 mg/dL — ABNORMAL HIGH (ref 0–200)
HDL: 52 mg/dL (ref >39)
LDL Cholesterol: 207 mg/dL — ABNORMAL HIGH (ref 0–99)
TRIGLYCERIDES: 171 mg/dL — AB (ref <150)
Total CHOL/HDL Ratio: 5.6 Ratio
VLDL: 34 mg/dL (ref 0–40)

## 2013-03-04 LAB — CMP AND LIVER
ALK PHOS: 37 U/L — AB (ref 39–117)
ALT: 15 U/L (ref 0–35)
AST: 14 U/L (ref 0–37)
Albumin: 4.4 g/dL (ref 3.5–5.2)
BILIRUBIN INDIRECT: 0.3 mg/dL (ref 0.0–0.9)
BILIRUBIN TOTAL: 0.4 mg/dL (ref 0.3–1.2)
BUN: 10 mg/dL (ref 6–23)
Bilirubin, Direct: 0.1 mg/dL (ref 0.0–0.3)
CO2: 27 mEq/L (ref 19–32)
Calcium: 9.5 mg/dL (ref 8.4–10.5)
Chloride: 100 mEq/L (ref 96–112)
Creat: 0.47 mg/dL — ABNORMAL LOW (ref 0.50–1.10)
GLUCOSE: 135 mg/dL — AB (ref 70–99)
Potassium: 4.2 mEq/L (ref 3.5–5.3)
Sodium: 138 mEq/L (ref 135–145)
Total Protein: 7.5 g/dL (ref 6.0–8.3)

## 2013-03-04 LAB — POCT GLYCOSYLATED HEMOGLOBIN (HGB A1C): Hemoglobin A1C: 6.2

## 2013-03-04 LAB — TSH: TSH: 1.813 u[IU]/mL (ref 0.350–4.500)

## 2013-03-04 MED ORDER — METFORMIN HCL 850 MG PO TABS: 850.0000 mg | ORAL_TABLET | Freq: Two times a day (BID) | ORAL | Status: DC

## 2013-03-04 NOTE — Progress Notes (Signed)
Pt is here for a f/u and diabetes check up.

## 2013-03-04 NOTE — Patient Instructions (Signed)

## 2013-03-04 NOTE — Progress Notes (Signed)
Patient ID: Erin Hawkins, female   DOB: 12/06/1970, 43 y.o.   MRN: [id number] Patient Demographics  Erin Hawkins, is a 43 y.o. female  [id number]  [id number]  DOB - Nov 03, 1970  Chief Complaint  Patient presents with  . Follow-up    3 month        Subjective:   Erin Hawkins is a 43 y.o. female here today for a follow up visit. Patient is here for followup of diabetes mellitus. Last hemoglobin A1c 3 months ago was 6%, she claimed to be doing well at home, no episode of hypoglycemia, no ER visit, blood sugar range is between 95 and 175. She has no complaints today. She needs a refill of her medications. She does not drink alcohol, she does not smoke cigarette. She is due for lab draw today and hemoglobin A1c. Patient has No headache, No chest pain, No abdominal pain - No Nausea, No new weakness tingling or numbness, No Cough - SOB.  ALLERGIES: No Known Allergies  PAST MEDICAL HISTORY: Past Medical History  Diagnosis Date  . Kidney stone   . Diabetes mellitus without complication     MEDICATIONS AT HOME: Prior to Admission medications   Medication Sig Start Date End Date Taking? Authorizing Provider  metFORMIN (GLUCOPHAGE) 850 MG tablet Take 1 tablet (850 mg total) by mouth 2 (two) times daily with a meal. 03/04/13  Yes Angelica Chessman, MD  glucose monitoring kit (FREESTYLE) monitoring kit 1 each by Does not apply route as needed for other. 10/02/12   Ripudeep Krystal Eaton, MD  levalbuterol (XOPENEX HFA) 45 MCG/ACT inhaler Inhale 1 puff into the lungs every 8 (eight) hours as needed for wheezing or shortness of breath. 10/02/12   Ripudeep Krystal Eaton, MD     Objective:   Filed Vitals:   03/04/13 0900  BP: 120/81  Pulse: 102  Temp: 98.7 F (37.1 C)  TempSrc: Oral  Resp: 14  Height: 5' 3"  (1.6 m)  Weight: 222 lb 9.6 oz (100.971 kg)  SpO2: 97%    Exam General appearance : Awake, alert, not in any distress. Speech Clear. Not toxic looking, obese HEENT: Atraumatic and Normocephalic,  pupils equally reactive to light and accomodation Neck: supple, no JVD. No cervical lymphadenopathy.  Chest:Good air entry bilaterally, no added sounds  CVS: S1 S2 regular, no murmurs.  Abdomen: Bowel sounds present, Non tender and not distended with no gaurding, rigidity or rebound. Extremities: B/L Lower Ext shows no edema, both legs are warm to touch Neurology: Awake alert, and oriented X 3, CN II-XII intact, Non focal Skin:No Rash Wounds:N/A   Data Review   CBC No results found for this basename: WBC, HGB, HCT, PLT, MCV, MCH, MCHC, RDW, NEUTRABS, LYMPHSABS, MONOABS, EOSABS, BASOSABS, BANDABS, BANDSABD,  in the last 168 hours  Chemistries   No results found for this basename: NA, K, CL, CO2, GLUCOSE, BUN, CREATININE, GFRCGP, CALCIUM, MG, AST, ALT, ALKPHOS, BILITOT,  in the last 168 hours ------------------------------------------------------------------------------------------------------------------  Recent Labs  03/04/13 0918  HGBA1C 6.2   ------------------------------------------------------------------------------------------------------------------ No results found for this basename: CHOL, HDL, LDLCALC, TRIG, CHOLHDL, LDLDIRECT,  in the last 72 hours ------------------------------------------------------------------------------------------------------------------ No results found for this basename: TSH, T4TOTAL, FREET3, T3FREE, THYROIDAB,  in the last 72 hours ------------------------------------------------------------------------------------------------------------------ No results found for this basename: VITAMINB12, FOLATE, FERRITIN, TIBC, IRON, RETICCTPCT,  in the last 72 hours  Coagulation profile  No results found for this basename: INR, PROTIME,  in the last 168 hours    Assessment & Plan  1. Diabetes  - HgB A1c is 6.2% today  2. DM2 (diabetes mellitus, type 2) Refill - metFORMIN (GLUCOPHAGE) 850 MG tablet; Take 1 tablet (850 mg total) by mouth 2 (two)  times daily with a meal.  Dispense: 180 tablet; Refill: 3  - CMP and Liver - TSH - Lipid panel  Patient was extensively counseled about nutrition and exercise  Follow up in 3 months or when necessary  The patient was given clear instructions to go to ER or return to medical center if symptoms don't improve, worsen or new problems develop. The patient verbalized understanding. The patient was told to call to get lab results if they haven't heard anything in the next week.    Angelica Chessman, MD, [place], [place], [place] and [place], [place]   03/04/2013, 9:36 AM

## 2013-03-28 ENCOUNTER — Telehealth: Payer: Self-pay | Admitting: *Deleted

## 2013-03-28 DIAGNOSIS — E7800 Pure hypercholesterolemia, unspecified: Secondary | ICD-10-CM

## 2013-03-28 MED ORDER — ATORVASTATIN CALCIUM 20 MG PO TABS: 20.0000 mg | ORAL_TABLET | Freq: Every day | ORAL | Status: DC

## 2013-03-28 NOTE — Telephone Encounter (Signed)
I called the pt and informed her that her blood sugar was high and her cholesterol level was very high. I encourage her to adhere with low carbohydrate low cholesterol low fat diet as well as regular physical exercise at least 3 times a week 30 minutes each time. I informed her that I was going to prescribe Lipitor to take every day.

## 2013-03-28 NOTE — Telephone Encounter (Signed)
Message copied by Raynelle CharyWINFREE, Arick Mareno R on Fri Mar 28, 2013  9:47 AM ------      Message from: Jeanann LewandowskyJEGEDE, OLUGBEMIGA E      Created: Wed Mar 26, 2013 10:36 AM       Please inform patient that her laboratory test results are mostly normal except for high blood sugar and very high cholesterol level. We will need to start her on cholesterol medication, we also encourage her to adhere with low carbohydrate low cholesterol low fat diet as well as regular physical exercise at least 3 times a week 30 minutes each time.            Please call in Lipitor 20 mg tablet by mouth daily, 90 tablets with 3 refills ------

## 2013-06-02 ENCOUNTER — Encounter: Payer: Self-pay | Admitting: Internal Medicine

## 2013-06-02 ENCOUNTER — Ambulatory Visit: Payer: Self-pay | Attending: Internal Medicine | Admitting: Internal Medicine

## 2013-06-02 VITALS — BP 122/79 | HR 103 | Temp 99.5°F | Resp 16 | Ht 64.0 in | Wt 220.0 lb

## 2013-06-02 DIAGNOSIS — Z09 Encounter for follow-up examination after completed treatment for conditions other than malignant neoplasm: Secondary | ICD-10-CM | POA: Insufficient documentation

## 2013-06-02 DIAGNOSIS — E785 Hyperlipidemia, unspecified: Secondary | ICD-10-CM | POA: Insufficient documentation

## 2013-06-02 DIAGNOSIS — E119 Type 2 diabetes mellitus without complications: Secondary | ICD-10-CM

## 2013-06-02 DIAGNOSIS — R509 Fever, unspecified: Secondary | ICD-10-CM

## 2013-06-02 LAB — CBC WITH DIFFERENTIAL/PLATELET
BASOS ABS: 0 10*3/uL (ref 0.0–0.1)
BASOS PCT: 0 % (ref 0–1)
EOS ABS: 0 10*3/uL (ref 0.0–0.7)
EOS PCT: 0 % (ref 0–5)
HCT: 33.6 % — ABNORMAL LOW (ref 36.0–46.0)
Hemoglobin: 11.7 g/dL — ABNORMAL LOW (ref 12.0–15.0)
LYMPHS PCT: 7 % — AB (ref 12–46)
Lymphs Abs: 1.3 10*3/uL (ref 0.7–4.0)
MCH: 28.8 pg (ref 26.0–34.0)
MCHC: 34.8 g/dL (ref 30.0–36.0)
MCV: 82.8 fL (ref 78.0–100.0)
Monocytes Absolute: 1.3 10*3/uL — ABNORMAL HIGH (ref 0.1–1.0)
Monocytes Relative: 7 % (ref 3–12)
Neutro Abs: 15.4 10*3/uL — ABNORMAL HIGH (ref 1.7–7.7)
Neutrophils Relative %: 86 % — ABNORMAL HIGH (ref 43–77)
PLATELETS: 301 10*3/uL (ref 150–400)
RBC: 4.06 MIL/uL (ref 3.87–5.11)
RDW: 12.8 % (ref 11.5–15.5)
WBC: 17.9 10*3/uL — AB (ref 4.0–10.5)

## 2013-06-02 LAB — COMPLETE METABOLIC PANEL WITH GFR
ALBUMIN: 3.5 g/dL (ref 3.5–5.2)
ALT: 51 U/L — ABNORMAL HIGH (ref 0–35)
AST: 25 U/L (ref 0–37)
Alkaline Phosphatase: 55 U/L (ref 39–117)
BUN: 7 mg/dL (ref 6–23)
CALCIUM: 8.9 mg/dL (ref 8.4–10.5)
CHLORIDE: 93 meq/L — AB (ref 96–112)
CO2: 24 mEq/L (ref 19–32)
Creat: 0.64 mg/dL (ref 0.50–1.10)
GFR, Est African American: >89 mL/min
GFR, Est Non African American: >89 mL/min
GLUCOSE: 253 mg/dL — AB (ref 70–99)
POTASSIUM: 3.6 meq/L (ref 3.5–5.3)
Sodium: 135 mEq/L (ref 135–145)
TOTAL PROTEIN: 6.9 g/dL (ref 6.0–8.3)
Total Bilirubin: 0.5 mg/dL (ref 0.2–1.2)

## 2013-06-02 LAB — GLUCOSE, POCT (MANUAL RESULT ENTRY): POC GLUCOSE: 255 mg/dL — AB (ref 70–99)

## 2013-06-02 LAB — POCT GLYCOSYLATED HEMOGLOBIN (HGB A1C): Hemoglobin A1C: 6.8

## 2013-06-02 MED ORDER — METFORMIN HCL 1000 MG PO TABS: 1000.0000 mg | ORAL_TABLET | Freq: Two times a day (BID) | ORAL | Status: AC

## 2013-06-02 NOTE — Progress Notes (Signed)
Pt is here today following up on her diabetes. Pt states that she has flu like symptoms for a week now.

## 2013-06-02 NOTE — Patient Instructions (Signed)
Fat and Cholesterol Control Diet Fat and cholesterol levels in your blood and organs are influenced by your diet. High levels of fat and cholesterol may lead to diseases of the heart, small and large blood vessels, gallbladder, liver, and pancreas. CONTROLLING FAT AND CHOLESTEROL WITH DIET Although exercise and lifestyle factors are important, your diet is key. That is because certain foods are known to raise cholesterol and others to lower it. The goal is to balance foods for their effect on cholesterol and more importantly, to replace saturated and trans fat with other types of fat, such as monounsaturated fat, polyunsaturated fat, and omega-3 fatty acids. On average, a person should consume no more than 15 to 17 g of saturated fat daily. Saturated and trans fats are considered "bad" fats, and they will raise LDL cholesterol. Saturated fats are primarily found in animal products such as meats, butter, and cream. However, that does not mean you need to give up all your favorite foods. Today, there are good tasting, low-fat, low-cholesterol substitutes for most of the things you like to eat. Choose low-fat or nonfat alternatives. Choose round or loin cuts of red meat. These types of cuts are lowest in fat and cholesterol. Chicken (without the skin), fish, veal, and ground turkey breast are great choices. Eliminate fatty meats, such as hot dogs and salami. Even shellfish have little or no saturated fat. Have a 3 oz (85 g) portion when you eat lean meat, poultry, or fish. Trans fats are also called "partially hydrogenated oils." They are oils that have been scientifically manipulated so that they are solid at room temperature resulting in a longer shelf life and improved taste and texture of foods in which they are added. Trans fats are found in stick margarine, some tub margarines, cookies, crackers, and baked goods.  When baking and cooking, oils are a great substitute for butter. The monounsaturated oils are  especially beneficial since it is believed they lower LDL and raise HDL. The oils you should avoid entirely are saturated tropical oils, such as coconut and palm.  Remember to eat a lot from food groups that are naturally free of saturated and trans fat, including fish, fruit, vegetables, beans, grains (barley, rice, couscous, bulgur wheat), and pasta (without cream sauces).  IDENTIFYING FOODS THAT LOWER FAT AND CHOLESTEROL  Soluble fiber may lower your cholesterol. This type of fiber is found in fruits such as apples, vegetables such as broccoli, potatoes, and carrots, legumes such as beans, peas, and lentils, and grains such as barley. Foods fortified with plant sterols (phytosterol) may also lower cholesterol. You should eat at least 2 g per day of these foods for a cholesterol lowering effect.  Read package labels to identify low-saturated fats, trans fat free, and low-fat foods at the supermarket. Select cheeses that have only 2 to 3 g saturated fat per ounce. Use a heart-healthy tub margarine that is free of trans fats or partially hydrogenated oil. When buying baked goods (cookies, crackers), avoid partially hydrogenated oils. Breads and muffins should be made from whole grains (whole-wheat or whole oat flour, instead of "flour" or "enriched flour"). Buy non-creamy canned soups with reduced salt and no added fats.  FOOD PREPARATION TECHNIQUES  Never deep-fry. If you must fry, either stir-fry, which uses very little fat, or use non-stick cooking sprays. When possible, broil, bake, or roast meats, and steam vegetables. Instead of putting butter or margarine on vegetables, use lemon and herbs, applesauce, and cinnamon (for squash and sweet potatoes). Use nonfat   yogurt, salsa, and low-fat dressings for salads.  LOW-SATURATED FAT / LOW-FAT FOOD SUBSTITUTES Meats / Saturated Fat (g)  Avoid: Steak, marbled (3 oz/85 g) / 11 g  Choose: Steak, lean (3 oz/85 g) / 4 g  Avoid: Hamburger (3 oz/85 g) / 7  g  Choose: Hamburger, lean (3 oz/85 g) / 5 g  Avoid: Ham (3 oz/85 g) / 6 g  Choose: Ham, lean cut (3 oz/85 g) / 2.4 g  Avoid: Chicken, with skin, dark meat (3 oz/85 g) / 4 g  Choose: Chicken, skin removed, dark meat (3 oz/85 g) / 2 g  Avoid: Chicken, with skin, light meat (3 oz/85 g) / 2.5 g  Choose: Chicken, skin removed, light meat (3 oz/85 g) / 1 g Dairy / Saturated Fat (g)  Avoid: Whole milk (1 cup) / 5 g  Choose: Low-fat milk, 2% (1 cup) / 3 g  Choose: Low-fat milk, 1% (1 cup) / 1.5 g  Choose: Skim milk (1 cup) / 0.3 g  Avoid: Hard cheese (1 oz/28 g) / 6 g  Choose: Skim milk cheese (1 oz/28 g) / 2 to 3 g  Avoid: Cottage cheese, 4% fat (1 cup) / 6.5 g  Choose: Low-fat cottage cheese, 1% fat (1 cup) / 1.5 g  Avoid: Ice cream (1 cup) / 9 g  Choose: Sherbet (1 cup) / 2.5 g  Choose: Nonfat frozen yogurt (1 cup) / 0.3 g  Choose: Frozen fruit bar / trace  Avoid: Whipped cream (1 tbs) / 3.5 g  Choose: Nondairy whipped topping (1 tbs) / 1 g Condiments / Saturated Fat (g)  Avoid: Mayonnaise (1 tbs) / 2 g  Choose: Low-fat mayonnaise (1 tbs) / 1 g  Avoid: Butter (1 tbs) / 7 g  Choose: Extra light margarine (1 tbs) / 1 g  Avoid: Coconut oil (1 tbs) / 11.8 g  Choose: Olive oil (1 tbs) / 1.8 g  Choose: Corn oil (1 tbs) / 1.7 g  Choose: Safflower oil (1 tbs) / 1.2 g  Choose: Sunflower oil (1 tbs) / 1.4 g  Choose: Soybean oil (1 tbs) / 2.4 g  Choose: Canola oil (1 tbs) / 1 g Document Released: 02/13/2005 Document Revised: 06/10/2012 Document Reviewed: 08/04/2010 [employer] Patient Information 2014 [place], [place]. Hypertriglyceridemia  Diet for High blood levels of Triglycerides Most fats in food are triglycerides. Triglycerides in your blood are stored as fat in your body. High levels of triglycerides in your blood may put you at a greater risk for heart disease and stroke.  Normal triglyceride levels are less than 150 mg/dL. Borderline high levels are  150-199 mg/dl. High levels are 200 - 499 mg/dL, and very high triglyceride levels are greater than 500 mg/dL. The decision to treat high triglycerides is generally based on the level. For people with borderline or high triglyceride levels, treatment includes weight loss and exercise. Drugs are recommended for people with very high triglyceride levels. Many people who need treatment for high triglyceride levels have metabolic syndrome. This syndrome is a collection of disorders that often include: insulin resistance, high blood pressure, blood clotting problems, high cholesterol and triglycerides. TESTING PROCEDURE FOR TRIGLYCERIDES  You should not eat 4 hours before getting your triglycerides measured. The normal range of triglycerides is between 10 and 250 milligrams per deciliter (mg/dl). Some people may have extreme levels (1000 or above), but your triglyceride level may be too high if it is above 150 mg/dl, depending on what other risk factors you have  for heart disease.  People with high blood triglycerides may also have high blood cholesterol levels. If you have high blood cholesterol as well as high blood triglycerides, your risk for heart disease is probably greater than if you only had high triglycerides. High blood cholesterol is one of the main risk factors for heart disease. CHANGING YOUR DIET  Your weight can affect your blood triglyceride level. If you are more than 20% above your ideal body weight, you may be able to lower your blood triglycerides by losing weight. Eating less and exercising regularly is the best way to combat this. Fat provides more calories than any other food. The best way to lose weight is to eat less fat. Only 30% of your total calories should come from fat. Less than 7% of your diet should come from saturated fat. A diet low in fat and saturated fat is the same as a diet to decrease blood cholesterol. By eating a diet lower in fat, you may lose weight, lower your blood  cholesterol, and lower your blood triglyceride level.  Eating a diet low in fat, especially saturated fat, may also help you lower your blood triglyceride level. Ask your dietitian to help you figure how much fat you can eat based on the number of calories your caregiver has prescribed for you.  Exercise, in addition to helping with weight loss may also help lower triglyceride levels.   Alcohol can increase blood triglycerides. You may need to stop drinking alcoholic beverages.  Too much carbohydrate in your diet may also increase your blood triglycerides. Some complex carbohydrates are necessary in your diet. These may include bread, rice, potatoes, other starchy vegetables and cereals.  Reduce "simple" carbohydrates. These may include pure sugars, candy, honey, and jelly without losing other nutrients. If you have the kind of high blood triglycerides that is affected by the amount of carbohydrates in your diet, you will need to eat less sugar and less high-sugar foods. Your caregiver can help you with this.  Adding 2-4 grams of fish oil (EPA+ DHA) may also help lower triglycerides. Speak with your caregiver before adding any supplements to your regimen. Following the Diet  Maintain your ideal weight. Your caregivers can help you with a diet. Generally, eating less food and getting more exercise will help you lose weight. Joining a weight control group may also help. Ask your caregivers for a good weight control group in your area.  Eat low-fat foods instead of high-fat foods. This can help you lose weight too.  These foods are lower in fat. Eat MORE of these:   Dried beans, peas, and lentils.  Egg whites.  Low-fat cottage cheese.  Fish.  Lean cuts of meat, such as round, sirloin, rump, and flank (cut extra fat off meat you fix).  Whole grain breads, cereals and pasta.  Skim and nonfat dry milk.  Low-fat yogurt.  Poultry without the skin.  Cheese made with skim or part-skim milk,  such as mozzarella, parmesan, farmers', ricotta, or pot cheese. These are higher fat foods. Eat LESS of these:   Whole milk and foods made from whole milk, such as American, blue, cheddar, monterey jack, and swiss cheese  High-fat meats, such as luncheon meats, sausages, knockwurst, bratwurst, hot dogs, ribs, corned beef, ground pork, and regular ground beef.  Fried foods. Limit saturated fats in your diet. Substituting unsaturated fat for saturated fat may decrease your blood triglyceride level. You will need to read package labels to know which products  contain saturated fats.  These foods are high in saturated fat. Eat LESS of these:   Fried pork skins.  Whole milk.  Skin and fat from poultry.  Palm oil.  Butter.  Shortening.  Cream cheese.  Tomasa Blase.  Margarines and baked goods made from listed oils.  Vegetable shortenings.  Chitterlings.  Fat from meats.  Coconut oil.  Palm kernel oil.  Lard.  Cream.  Sour cream.  Fatback.  Coffee whiteners and non-dairy creamers made with these oils.  Cheese made from whole milk. Use unsaturated fats (both polyunsaturated and monounsaturated) moderately. Remember, even though unsaturated fats are better than saturated fats; you still want a diet low in total fat.  These foods are high in unsaturated fat:   Canola oil.  Sunflower oil.  Mayonnaise.  Almonds.  Peanuts.  Pine nuts.  Margarines made with these oils.  Safflower oil.  Olive oil.  Avocados.  Cashews.  Peanut butter.  Sunflower seeds.  Soybean oil.  Peanut oil.  Olives.  Pecans.  Walnuts.  Pumpkin seeds. Avoid sugar and other high-sugar foods. This will decrease carbohydrates without decreasing other nutrients. Sugar in your food goes rapidly to your blood. When there is excess sugar in your blood, your liver may use it to make more triglycerides. Sugar also contains calories without other important nutrients.  Eat LESS of these:    Sugar, brown sugar, powdered sugar, jam, jelly, preserves, honey, syrup, molasses, pies, candy, cakes, cookies, frosting, pastries, colas, soft drinks, punches, fruit drinks, and regular gelatin.  Avoid alcohol. Alcohol, even more than sugar, may increase blood triglycerides. In addition, alcohol is high in calories and low in nutrients. Ask for sparkling water, or a diet soft drink instead of an alcoholic beverage. Suggestions for planning and preparing meals   Bake, broil, grill or roast meats instead of frying.  Remove fat from meats and skin from poultry before cooking.  Add spices, herbs, lemon juice or vinegar to vegetables instead of salt, rich sauces or gravies.  Use a non-stick skillet without fat or use no-stick sprays.  Cool and refrigerate stews and broth. Then remove the hardened fat floating on the surface before serving.  Refrigerate meat drippings and skim off fat to make low-fat gravies.  Serve more fish.  Use less butter, margarine and other high-fat spreads on bread or vegetables.  Use skim or reconstituted non-fat dry milk for cooking.  Cook with low-fat cheeses.  Substitute low-fat yogurt or cottage cheese for all or part of the sour cream in recipes for sauces, dips or congealed salads.  Use half yogurt/half mayonnaise in salad recipes.  Substitute evaporated skim milk for cream. Evaporated skim milk or reconstituted non-fat dry milk can be whipped and substituted for whipped cream in certain recipes.  Choose fresh fruits for dessert instead of high-fat foods such as pies or cakes. Fruits are naturally low in fat. When Dining Out   Order low-fat appetizers such as fruit or vegetable juice, pasta with vegetables or tomato sauce.  Select clear, rather than cream soups.  Ask that dressings and gravies be served on the side. Then use less of them.  Order foods that are baked, broiled, poached, steamed, stir-fried, or roasted.  Ask for margarine instead  of butter, and use only a small amount.  Drink sparkling water, unsweetened tea or coffee, or diet soft drinks instead of alcohol or other sweet beverages. QUESTIONS AND ANSWERS ABOUT OTHER FATS IN THE BLOOD: SATURATED FAT, TRANS FAT, AND CHOLESTEROL What is trans  fat? Trans fat is a type of fat that is formed when vegetable oil is hardened through a process called hydrogenation. This process helps makes foods more solid, gives them shape, and prolongs their shelf life. Trans fats are also called hydrogenated or partially hydrogenated oils.  What do saturated fat, trans fat, and cholesterol in foods have to do with heart disease? Saturated fat, trans fat, and cholesterol in the diet all raise the level of LDL "bad" cholesterol in the blood. The higher the LDL cholesterol, the greater the risk for coronary heart disease (CHD). Saturated fat and trans fat raise LDL similarly.  What foods contain saturated fat, trans fat, and cholesterol? High amounts of saturated fat are found in animal products, such as fatty cuts of meat, chicken skin, and full-fat dairy products like butter, whole milk, cream, and cheese, and in tropical vegetable oils such as palm, palm kernel, and coconut oil. Trans fat is found in some of the same foods as saturated fat, such as vegetable shortening, some margarines (especially hard or stick margarine), crackers, cookies, baked goods, fried foods, salad dressings, and other processed foods made with partially hydrogenated vegetable oils. Small amounts of trans fat also occur naturally in some animal products, such as milk products, beef, and lamb. Foods high in cholesterol include liver, other organ meats, egg yolks, shrimp, and full-fat dairy products. How can I use the new food label to make heart-healthy food choices? Check the Nutrition Facts panel of the food label. Choose foods lower in saturated fat, trans fat, and cholesterol. For saturated fat and cholesterol, you can also  use the Percent Daily Value (%DV): 5% DV or less is low, and 20% DV or more is high. (There is no %DV for trans fat.) Use the Nutrition Facts panel to choose foods low in saturated fat and cholesterol, and if the trans fat is not listed, read the ingredients and limit products that list shortening or hydrogenated or partially hydrogenated vegetable oil, which tend to be high in trans fat. POINTS TO REMEMBER:   Discuss your risk for heart disease with your caregivers, and take steps to reduce risk factors.  Change your diet. Choose foods that are low in saturated fat, trans fat, and cholesterol.  Add exercise to your daily routine if it is not already being done. Participate in physical activity of moderate intensity, like brisk walking, for at least 30 minutes on most, and preferably all days of the week. No time? Break the 30 minutes into three, 10-minute segments during the day.  Stop smoking. If you do smoke, contact your caregiver to discuss ways in which they can help you quit.  Do not use street drugs.  Maintain a normal weight.  Maintain a healthy blood pressure.  Keep up with your blood work for checking the fats in your blood as directed by your caregiver. Document Released: 12/02/2003 Document Revised: 08/15/2011 Document Reviewed: 06/29/2008 [facility] Patient Information 2014 [place], [place]. Diabetes Meal Planning Guide The diabetes meal planning guide is a tool to help you plan your meals and snacks. It is important for people with diabetes to manage their blood glucose (sugar) levels. Choosing the right foods and the right amounts throughout your day will help control your blood glucose. Eating right can even help you improve your blood pressure and reach or maintain a healthy weight. CARBOHYDRATE COUNTING MADE EASY When you eat carbohydrates, they turn to sugar. This raises your blood glucose level. Counting carbohydrates can help you control this level  so you feel better. When  you plan your meals by counting carbohydrates, you can have more flexibility in what you eat and balance your medicine with your food intake. Carbohydrate counting simply means adding up the total amount of carbohydrate grams in your meals and snacks. Try to eat about the same amount at each meal. Foods with carbohydrates are listed below. Each portion below is 1 carbohydrate serving or 15 grams of carbohydrates. Ask your dietician how many grams of carbohydrates you should eat at each meal or snack. Grains and Starches  1 slice bread.   English muffin or hotdog/hamburger bun.   cup cold cereal (unsweetened).   cup cooked pasta or rice.   cup starchy vegetables (corn, potatoes, peas, beans, winter squash).  1 tortilla (6 inches).   bagel.  1 waffle or pancake (size of a CD).   cup cooked cereal.  4 to 6 small crackers. *Whole grain is recommended. Fruit  1 cup fresh unsweetened berries, melon, papaya, pineapple.  1 small fresh fruit.   banana or mango.   cup fruit juice (4 oz unsweetened).   cup canned fruit in natural juice or water.  2 tbs dried fruit.  12 to 15 grapes or cherries. Milk and Yogurt  1 cup fat-free or 1% milk.  1 cup soy milk.  6 oz light yogurt with sugar-free sweetener.  6 oz low-fat soy yogurt.  6 oz plain yogurt. Vegetables  1 cup raw or  cup cooked is counted as 0 carbohydrates or a "free" food.  If you eat 3 or more servings at 1 meal, count them as 1 carbohydrate serving. Other Carbohydrates   oz chips or pretzels.   cup ice cream or frozen yogurt.   cup sherbet or sorbet.  2 inch square cake, no frosting.  1 tbs honey, sugar, jam, jelly, or syrup.  2 small cookies.  3 squares of graham crackers.  3 cups popcorn.  6 crackers.  1 cup broth-based soup.  Count 1 cup casserole or other mixed foods as 2 carbohydrate servings.  Foods with less than 20 calories in a serving may be counted as 0 carbohydrates or  a "free" food. You may want to purchase a book or computer software that lists the carbohydrate gram counts of different foods. In addition, the nutrition facts panel on the labels of the foods you eat are a good source of this information. The label will tell you how big the serving size is and the total number of carbohydrate grams you will be eating per serving. Divide this number by 15 to obtain the number of carbohydrate servings in a portion. Remember, 1 carbohydrate serving equals 15 grams of carbohydrate. SERVING SIZES Measuring foods and serving sizes helps you make sure you are getting the right amount of food. The list below tells how big or small some common serving sizes are.  1 oz.........4 stacked dice.  3 oz........Marland KitchenDeck of cards.  1 tsp.......Marland KitchenTip of little finger.  1 tbs......Marland KitchenMarland KitchenThumb.  2 tbs.......Marland KitchenGolf ball.   cup......Marland KitchenHalf of a fist.  1 cup.......Marland KitchenA fist. SAMPLE DIABETES MEAL PLAN Below is a sample meal plan that includes foods from the grain and starches, dairy, vegetable, fruit, and meat groups. A dietician can individualize a meal plan to fit your calorie needs and tell you the number of servings needed from each food group. However, controlling the total amount of carbohydrates in your meal or snack is more important than making sure you include all of the food groups  at every meal. You may interchange carbohydrate containing foods (dairy, starches, and fruits). The meal plan below is an example of a 2000 calorie diet using carbohydrate counting. This meal plan has 17 carbohydrate servings. Breakfast  1 cup oatmeal (2 carb servings).   cup light yogurt (1 carb serving).  1 cup blueberries (1 carb serving).   cup almonds. Snack  1 large apple (2 carb servings).  1 low-fat string cheese stick. Lunch  Chicken breast salad.  1 cup spinach.   cup chopped tomatoes.  2 oz chicken breast, sliced.  2 tbs low-fat [place] dressing.  12 whole-wheat  crackers (2 carb servings).  12 to 15 grapes (1 carb serving).  1 cup low-fat milk (1 carb serving). Snack  1 cup carrots.   cup hummus (1 carb serving). Dinner  3 oz broiled salmon.  1 cup brown rice (3 carb servings). Snack  1  cups steamed broccoli (1 carb serving) drizzled with 1 tsp olive oil and lemon juice.  1 cup light pudding (2 carb servings). DIABETES MEAL PLANNING WORKSHEET Your dietician can use this worksheet to help you decide how many servings of foods and what types of foods are right for you.  BREAKFAST Food Group and Servings / Carb Servings Grain/Starches __________________________________ Dairy __________________________________________ Vegetable ______________________________________ Fruit ___________________________________________ Meat __________________________________________ Fat ____________________________________________ LUNCH Food Group and Servings / Carb Servings Grain/Starches ___________________________________ Dairy ___________________________________________ Fruit ____________________________________________ Meat ___________________________________________ Fat _____________________________________________ Laural Golden Food Group and Servings / Carb Servings Grain/Starches ___________________________________ Dairy ___________________________________________ Fruit ____________________________________________ Meat ___________________________________________ Fat _____________________________________________ SNACKS Food Group and Servings / Carb Servings Grain/Starches ___________________________________ Dairy ___________________________________________ Vegetable _______________________________________ Fruit ____________________________________________ Meat ___________________________________________ Fat _____________________________________________ DAILY TOTALS Starches _________________________ Vegetable  ________________________ Fruit ____________________________ Dairy ____________________________ Meat ____________________________ Fat ______________________________ Document Released: 11/10/2004 Document Revised: 05/08/2011 Document Reviewed: 09/21/2008 [employer] Patient Information 2014 [place].

## 2013-06-02 NOTE — Progress Notes (Signed)
Patient ID: Erin Hawkins, female   DOB: [birth year], 43 y.o.   MRN: [id number]   Erin Hawkins, is a 43 y.o. female  [id number]  [id number]  DOB - [birth year]  Chief Complaint  Patient presents with  . Follow-up        Subjective:   Erin Hawkins is a 43 y.o. female here today for a follow up visit. Patient is known to have diabetes on metformin 1000 mg tablet by mouth twice a day and dyslipidemia on atorvastatin 20 mg tablet by mouth daily. Patient claims to have stopped taking atorvastatin because of side effects which include pains in her legs due to muscle breakdown. She has also been sick for about a week now with high fever up to 102F. about a week ago she attended the funeral ceremony of her grandmother in [place] and since then she has been feeling very weak, fever and headache. Headache is global on tension-like, worse during the day, no relations to light, aggravated by moving the head sideways, relieved by ibuprofen. No nausea or vomiting. Fever is intermittent with occasional chills. No change in bowel habit, no urinary symptoms, patient has runny nose slight dry cough, no chest pain. No sore throat. No joint swelling. No night sweat except when she has chills/rigors. She has no history of contact with patient for the same symptoms. Patient has No chest pain, No abdominal pain - No Nausea, No new weakness tingling or numbness, No SOB.  Problem  Fever, Unspecified    ALLERGIES: No Known Allergies  PAST MEDICAL HISTORY: Past Medical History  Diagnosis Date  . Kidney stone   . Diabetes mellitus without complication     MEDICATIONS AT HOME: Prior to Admission medications   Medication Sig Start Date End Date Taking? Authorizing Provider  glucose monitoring kit (FREESTYLE) monitoring kit 1 each by Does not apply route as needed for other. 10/02/12  Yes Ripudeep Krystal Eaton, MD  metFORMIN (GLUCOPHAGE) 1000 MG tablet Take 1 tablet (1,000 mg total) by mouth 2 (two) times daily with a  meal. 06/02/13  Yes Angelica Chessman, MD  atorvastatin (LIPITOR) 20 MG tablet Take 1 tablet (20 mg total) by mouth daily. 03/28/13   Angelica Chessman, MD  levalbuterol (XOPENEX HFA) 45 MCG/ACT inhaler Inhale 1 puff into the lungs every 8 (eight) hours as needed for wheezing or shortness of breath. 10/02/12   Ripudeep Krystal Eaton, MD     Objective:   Filed Vitals:   06/02/13 0928  BP: 122/79  Pulse: 103  Temp: 99.5 F (37.5 C)  TempSrc: Oral  Resp: 16  Height: _0  (1.626 m)  Weight: 220 lb (99.791 kg)  SpO2: 96%    Exam General appearance : Awake, alert, not in any distress. Speech Clear. Not toxic looking, morbidly obese HEENT: Atraumatic and Normocephalic, pupils equally reactive to light and accomodation Neck: supple, no JVD. No cervical lymphadenopathy.  Chest:Good air entry bilaterally, no added sounds  CVS: Mild tachycardia S1 S2 regular, no murmurs.  Abdomen: Bowel sounds present, Non tender and not distended with no gaurding, rigidity or rebound. Extremities: B/L Lower Ext shows no edema, both legs are warm to touch Neurology: Awake alert, and oriented X 3, CN II-XII intact, Non focal Skin:No Rash Wounds:N/A  Data Review Lab Results  Component Value Date   HGBA1C 6.8 06/02/2013   HGBA1C 6.2 03/04/2013     Assessment & Plan   1. Diabetes  - Glucose (CBG) - HgB A1c is now 6.8% from 6.2% in January  2015, patient claims that she has not been taking her medication consistently due to to illness.   2. DM2 (diabetes mellitus, type 2) Patient has been encouraged to be compliant with her medication, we're not able to make changes today because of pain consistency in her compliance. We will continue the same dose and watch for another 3 months, patient was counseled also about nutrition and exercise. A diabetic diet outline has been given to patient. She has also been encouraged on low carbohydrate low fat, low cholesterol diet with physical exercise regularly  - metFORMIN  (GLUCOPHAGE) 1000 MG tablet; Take 1 tablet (1,000 mg total) by mouth 2 (two) times daily with a meal.  Dispense: 180 tablet; Refill: 3  3. Fever, unspecified, most likely viral Management will depend on findings from lab result Tylenol when necessary for fever - COMPLETE METABOLIC PANEL WITH GFR - CBC with Differential - Urinalysis, Complete  Patient was counseled extensively about nutrition and exercise Return if symptoms worsen or fail to improve, for Hemoglobin A1C and Follow up, DM.  The patient was given clear instructions to go to ER or return to medical center if symptoms don't improve, worsen or new problems develop. The patient verbalized understanding. The patient was told to call to get lab results if they haven't heard anything in the next week.   This note has been created with Surveyor, quantity. Any transcriptional errors are unintentional.    Angelica Chessman, MD, [place], [place], [place] and [place], [place]   06/02/2013, 10:11 AM

## 2013-06-03 LAB — URINALYSIS, COMPLETE
Casts: NONE SEEN
Crystals: NONE SEEN
Glucose, UA: 500 mg/dL — AB
Leukocytes, UA: NEGATIVE
NITRITE: NEGATIVE
PROTEIN: 100 mg/dL — AB
Specific Gravity, Urine: 1.013 (ref 1.005–1.030)
UROBILINOGEN UA: 0.2 mg/dL (ref 0.0–1.0)
pH: 6 (ref 5.0–8.0)

## 2013-06-04 ENCOUNTER — Telehealth: Payer: Self-pay

## 2013-06-04 ENCOUNTER — Telehealth: Payer: Self-pay | Admitting: Emergency Medicine

## 2013-06-04 MED ORDER — AMOXICILLIN-POT CLAVULANATE 875-125 MG PO TABS: 1.0000 | ORAL_TABLET | Freq: Two times a day (BID) | ORAL | Status: DC

## 2013-06-04 NOTE — Telephone Encounter (Signed)
Left message for pt to call for lab results with medication instructions 

## 2013-06-04 NOTE — Telephone Encounter (Signed)
Patient not available Left message on voice mail to return call 

## 2013-06-04 NOTE — Telephone Encounter (Signed)
Message copied by Lestine MountJUAREZ, Imaad Reuss L on Wed Jun 04, 2013  3:10 PM ------      Message from: Jeanann LewandowskyJEGEDE, OLUGBEMIGA E      Created: Wed Jun 04, 2013  2:45 PM       Please inform patient that her laboratory tests results show possibility of infection with a high white cell count. She also have slightly low hemoglobin level compared to 9 months ago, her blood sugar was high at the time of testing with hemoglobin A1c of 6.8%. One of her liver enzymes is slightly high but of unknown significance. Because of her symptoms which include fever and high white cell count, we will like to place her on antibiotics for at least 7 days.      Please call in prescription Augmentin 875 mg tablet by mouth twice a day, 14 tablets with no refill ------

## 2013-09-01 ENCOUNTER — Encounter: Payer: Self-pay | Admitting: Internal Medicine

## 2013-09-01 ENCOUNTER — Ambulatory Visit: Payer: Self-pay | Attending: Internal Medicine | Admitting: Internal Medicine

## 2013-09-01 VITALS — BP 117/77 | HR 88 | Temp 99.7°F | Resp 14 | Ht 64.0 in | Wt 228.0 lb

## 2013-09-01 DIAGNOSIS — E119 Type 2 diabetes mellitus without complications: Secondary | ICD-10-CM | POA: Insufficient documentation

## 2013-09-01 DIAGNOSIS — E7800 Pure hypercholesterolemia, unspecified: Secondary | ICD-10-CM | POA: Insufficient documentation

## 2013-09-01 DIAGNOSIS — E669 Obesity, unspecified: Secondary | ICD-10-CM | POA: Insufficient documentation

## 2013-09-01 LAB — POCT GLYCOSYLATED HEMOGLOBIN (HGB A1C): Hemoglobin A1C: 6

## 2013-09-01 LAB — GLUCOSE, POCT (MANUAL RESULT ENTRY): POC GLUCOSE: 126 mg/dL — AB (ref 70–99)

## 2013-09-01 NOTE — Progress Notes (Signed)
Patient ID: Erin Hawkins, female   DOB: 12-04-1970, 43 y.o.   MRN: [id number]   Erin Hawkins, is a 43 y.o. female  [id number]  [id number]  DOB - [birth year]  Chief Complaint  Patient presents with  . Follow-up        Subjective:   Erin Hawkins is a 43 y.o. female here today for a follow up visit. Patient is known to have diabetes mellitus type 2 on metformin, and dyslipidemia he is on Lipitor 20 mg tablet by mouth daily but patient has not been taking it because of side effects, saying that she strictly adherent with dietary control. She has no complaint today. She is here for her regular followup and hemoglobin A1c check. Last hemoglobin A1c was 6.8% in April. She says she is feeling much much better today, no side effects to metformin reported, she has changed her diet around and now doing well with low sugar, low carbohydrate and low cholesterol diet. She does not smoke cigarette she does not drink alcohol. Patient has No headache, No chest pain, No abdominal pain - No Nausea, No new weakness tingling or numbness, No Cough - SOB.  Problem  High Cholesterol  Type 2 Diabetes Mellitus Without Complication    ALLERGIES: No Known Allergies  PAST MEDICAL HISTORY: Past Medical History  Diagnosis Date  . Kidney stone   . Diabetes mellitus without complication     MEDICATIONS AT HOME: Prior to Admission medications   Medication Sig Start Date End Date Taking? Authorizing Provider  glucose monitoring kit (FREESTYLE) monitoring kit 1 each by Does not apply route as needed for other. 10/02/12  Yes Ripudeep Krystal Eaton, MD  metFORMIN (GLUCOPHAGE) 1000 MG tablet Take 1 tablet (1,000 mg total) by mouth 2 (two) times daily with a meal. 06/02/13  Yes Angelica Chessman, MD  amoxicillin-clavulanate (AUGMENTIN) 875-125 MG per tablet Take 1 tablet by mouth 2 (two) times daily. 06/04/13   Angelica Chessman, MD  atorvastatin (LIPITOR) 20 MG tablet Take 1 tablet (20 mg total) by mouth daily. 03/28/13    Angelica Chessman, MD  levalbuterol (XOPENEX HFA) 45 MCG/ACT inhaler Inhale 1 puff into the lungs every 8 (eight) hours as needed for wheezing or shortness of breath. 10/02/12   Ripudeep Krystal Eaton, MD     Objective:   Filed Vitals:   09/01/13 0943  BP: 117/77  Pulse: 88  Temp: 99.7 F (37.6 C)  TempSrc: Oral  Resp: 14  Height: 5' 4"  (1.626 m)  Weight: 228 lb (103.42 kg)  SpO2: 99%    Exam General appearance : Awake, alert, not in any distress. Speech Clear. Not toxic looking, obese  HEENT: Atraumatic and Normocephalic, pupils equally reactive to light and accomodation Neck: supple, no JVD. No cervical lymphadenopathy.  Chest:Good air entry bilaterally, no added sounds  CVS: S1 S2 regular, no murmurs.  Abdomen: Bowel sounds present, Non tender and not distended with no gaurding, rigidity or rebound. Extremities: B/L Lower Ext shows no edema, both legs are warm to touch Neurology: Awake alert, and oriented X 3, CN II-XII intact, Non focal Skin:No Rash Wounds:N/A  Data Review Lab Results  Component Value Date   HGBA1C 6.0 09/01/2013   HGBA1C 6.8 06/02/2013   HGBA1C 6.2 03/04/2013     Assessment & Plan   1. Type 2 diabetes mellitus without complication  - Glucose (CBG) - HgB A1c is 6.0% today, better than 6.8% 3 months ago Patient was encouraged to continue metformin and nutritional control as well as regular  physical exercise  2. Obesity, unspecified Patient was counseled extensively about nutrition and exercise Lipid profile next visit  Return in about 3 months (around 12/02/2013), or if symptoms worsen or fail to improve, for Hemoglobin A1C and Follow up, DM, and Fasting .lipid profile  The patient was given clear instructions to go to ER or return to medical center if symptoms don't improve, worsen or new problems develop. The patient verbalized understanding. The patient was told to call to get lab results if they haven't heard anything in the next week.   This note has  been created with Surveyor, quantity. Any transcriptional errors are unintentional.    Angelica Chessman, MD, [place], [place], [place] and [place], [place]   09/01/2013, 10:54 AM

## 2013-09-01 NOTE — Patient Instructions (Signed)
Diabetes and Exercise Exercising regularly is important. It is not just about losing weight. It has many health benefits, such as:  Improving your overall fitness, flexibility, and endurance.  Increasing your bone density.  Helping with weight control.  Decreasing your body fat.  Increasing your muscle strength.  Reducing stress and tension.  Improving your overall health. People with diabetes who exercise gain additional benefits because exercise:  Reduces appetite.  Improves the body's use of blood sugar (glucose).  Helps lower or control blood glucose.  Decreases blood pressure.  Helps control blood lipids (such as cholesterol and triglycerides).  Improves the body's use of the hormone insulin by:  Increasing the body's insulin sensitivity.  Reducing the body's insulin needs.  Decreases the risk for heart disease because exercising:  Lowers cholesterol and triglycerides levels.  Increases the levels of good cholesterol (such as high-density lipoproteins [HDL]) in the body.  Lowers blood glucose levels. YOUR ACTIVITY PLAN  Choose an activity that you enjoy and set realistic goals. Your health care provider or diabetes educator can help you make an activity plan that works for you. You can break activities into 2 or 3 sessions throughout the day. Doing so is as good as one long session. Exercise ideas include:  Taking the dog for a walk.  Taking the stairs instead of the elevator.  Dancing to your favorite song.  Doing your favorite exercise with a friend. RECOMMENDATIONS FOR EXERCISING WITH TYPE 1 OR TYPE 2 DIABETES   Check your blood glucose before exercising. If blood glucose levels are greater than 240 mg/dL, check for urine ketones. Do not exercise if ketones are present.  Avoid injecting insulin into areas of the body that are going to be exercised. For example, avoid injecting insulin into:  The arms when playing tennis.  The legs when  jogging.  Keep a record of:  Food intake before and after you exercise.  Expected peak times of insulin action.  Blood glucose levels before and after you exercise.  The type and amount of exercise you have done.  Review your records with your health care provider. Your health care provider will help you to develop guidelines for adjusting food intake and insulin amounts before and after exercising.  If you take insulin or oral hypoglycemic agents, watch for signs and symptoms of hypoglycemia. They include:  Dizziness.  Shaking.  Sweating.  Chills.  Confusion.  Drink plenty of water while you exercise to prevent dehydration or heat stroke. Body water is lost during exercise and must be replaced.  Talk to your health care provider before starting an exercise program to make sure it is safe for you. Remember, almost any type of activity is better than none. Document Released: 05/06/2003 Document Revised: 10/16/2012 Document Reviewed: 07/23/2012 ExitCare Patient Information 2015 [employer]. This information is not intended to replace advice given to you by your health care provider. Make sure you discuss any questions you have with your health care provider. Diabetes Mellitus and Food It is important for you to manage your blood sugar (glucose) level. Your blood glucose level can be greatly affected by what you eat. Eating healthier foods in the appropriate amounts throughout the day at about the same time each day will help you control your blood glucose level. It can also help slow or prevent worsening of your diabetes mellitus. Healthy eating may even help you improve the level of your blood pressure and reach or maintain a healthy weight.  HOW CAN FOOD AFFECT   ME? Carbohydrates Carbohydrates affect your blood glucose level more than any other type of food. Your dietitian will help you determine how many carbohydrates to eat at each meal and teach you how to count  carbohydrates. Counting carbohydrates is important to keep your blood glucose at a healthy level, especially if you are using insulin or taking certain medicines for diabetes mellitus. Alcohol Alcohol can cause sudden decreases in blood glucose (hypoglycemia), especially if you use insulin or take certain medicines for diabetes mellitus. Hypoglycemia can be a life-threatening condition. Symptoms of hypoglycemia (sleepiness, dizziness, and disorientation) are similar to symptoms of having too much alcohol.  If your health care provider has given you approval to drink alcohol, do so in moderation and use the following guidelines:  Women should not have more than one drink per day, and men should not have more than two drinks per day. One drink is equal to:  12 oz of beer.  5 oz of wine.  1 oz of hard liquor.  Do not drink on an empty stomach.  Keep yourself hydrated. Have water, diet soda, or unsweetened iced tea.  Regular soda, juice, and other mixers might contain a lot of carbohydrates and should be counted. WHAT FOODS ARE NOT RECOMMENDED? As you make food choices, it is important to remember that all foods are not the same. Some foods have fewer nutrients per serving than other foods, even though they might have the same number of calories or carbohydrates. It is difficult to get your body what it needs when you eat foods with fewer nutrients. Examples of foods that you should avoid that are high in calories and carbohydrates but low in nutrients include:  Trans fats (most processed foods list trans fats on the Nutrition Facts label).  Regular soda.  Juice.  Candy.  Sweets, such as cake, pie, doughnuts, and cookies.  Fried foods. WHAT FOODS CAN I EAT? Have nutrient-rich foods, which will nourish your body and keep you healthy. The food you should eat also will depend on several factors, including:  The calories you need.  The medicines you take.  Your weight.  Your blood  glucose level.  Your blood pressure level.  Your cholesterol level. You also should eat a variety of foods, including:  Protein, such as meat, poultry, fish, tofu, nuts, and seeds (lean animal proteins are best).  Fruits.  Vegetables.  Dairy products, such as milk, cheese, and yogurt (low fat is best).  Breads, grains, pasta, cereal, rice, and beans.  Fats such as olive oil, trans fat-free margarine, canola oil, avocado, and olives. DOES EVERYONE WITH DIABETES MELLITUS HAVE THE SAME MEAL PLAN? Because every person with diabetes mellitus is different, there is not one meal plan that works for everyone. It is very important that you meet with a dietitian who will help you create a meal plan that is just right for you. Document Released: 11/10/2004 Document Revised: 02/18/2013 Document Reviewed: 01/10/2013 ExitCare Patient Information 2015 [employer]. This information is not intended to replace advice given to you by your health care provider. Make sure you discuss any questions you have with your health care provider.  

## 2013-09-01 NOTE — Progress Notes (Signed)
Pt is here following up on her diabetes. 

## 2013-12-02 ENCOUNTER — Encounter: Payer: Self-pay | Admitting: Internal Medicine

## 2013-12-02 ENCOUNTER — Ambulatory Visit: Payer: Self-pay | Attending: Internal Medicine | Admitting: Internal Medicine

## 2013-12-02 VITALS — BP 116/83 | HR 90 | Temp 98.4°F | Resp 18 | Ht 63.0 in | Wt 233.4 lb

## 2013-12-02 DIAGNOSIS — E785 Hyperlipidemia, unspecified: Secondary | ICD-10-CM | POA: Insufficient documentation

## 2013-12-02 DIAGNOSIS — E119 Type 2 diabetes mellitus without complications: Secondary | ICD-10-CM | POA: Insufficient documentation

## 2013-12-02 LAB — POCT GLYCOSYLATED HEMOGLOBIN (HGB A1C): HEMOGLOBIN A1C: 6.5

## 2013-12-02 LAB — GLUCOSE, POCT (MANUAL RESULT ENTRY): POC GLUCOSE: 173 mg/dL — AB (ref 70–99)

## 2013-12-02 MED ORDER — ATORVASTATIN CALCIUM 20 MG PO TABS: 20.0000 mg | ORAL_TABLET | Freq: Every day | ORAL | Status: AC

## 2013-12-02 NOTE — Progress Notes (Signed)
Patient ID: Erin Hawkins, female   DOB: 1970-03-02, 43 y.o.   MRN: [id number]   Erin Hawkins, is a 43 y.o. female  [id number]  [id number]  DOB - 08-05-1970  Chief Complaint  Patient presents with  . Follow-up        Subjective:   Erin Hawkins is a 43 y.o. female here today for a follow up visit. Patient is known to have diabetes mellitus type 2 on metformin and dyslipidemia on Lipitor here today for a routine visit. She also needs a refill of her cholesterol medicine. She has no complaints today. She is compliant with medications and reports no side effects. No hypoglycemic episodes. Patient has No headache, No chest pain, No abdominal pain - No Nausea, No new weakness tingling or numbness, No Cough - SOB.  No problems updated.  ALLERGIES: No Known Allergies  PAST MEDICAL HISTORY: Past Medical History  Diagnosis Date  . Kidney stone   . Diabetes mellitus without complication     MEDICATIONS AT HOME: Prior to Admission medications   Medication Sig Start Date End Date Taking? Authorizing Provider  atorvastatin (LIPITOR) 20 MG tablet Take 1 tablet (20 mg total) by mouth daily. 12/02/13   Tresa Garter, MD  glucose monitoring kit (FREESTYLE) monitoring kit 1 each by Does not apply route as needed for other. 10/02/12   Ripudeep Krystal Eaton, MD  metFORMIN (GLUCOPHAGE) 1000 MG tablet Take 1 tablet (1,000 mg total) by mouth 2 (two) times daily with a meal. 06/02/13   Tresa Garter, MD     Objective:   Filed Vitals:   12/02/13 0926  BP: 116/83  Pulse: 90  Temp: 98.4 F (36.9 C)  TempSrc: Oral  Resp: 18  Height: _0  (1.6 m)  Weight: 233 lb 6.4 oz (105.87 kg)  SpO2: 99%    Exam General appearance : Awake, alert, not in any distress. Speech Clear. Not toxic looking HEENT: Atraumatic and Normocephalic, pupils equally reactive to light and accomodation Neck: supple, no JVD. No cervical lymphadenopathy.  Chest:Good air entry bilaterally, no added sounds  CVS: S1 S2  regular, no murmurs.  Abdomen: Bowel sounds present, Non tender and not distended with no gaurding, rigidity or rebound. Extremities: B/L Lower Ext shows no edema, both legs are warm to touch Neurology: Awake alert, and oriented X 3, CN II-XII intact, Non focal Skin:No Rash Wounds:N/A  Data Review Lab Results  Component Value Date   HGBA1C 6.5 12/02/2013   HGBA1C 6.0 09/01/2013   HGBA1C 6.8 06/02/2013     Assessment & Plan   1. Type 2 diabetes mellitus without complication  - Glucose (CBG) - HgB A1c 6.5% today Continue metformin at the current dose - atorvastatin (LIPITOR) 20 MG tablet; Take 1 tablet (20 mg total) by mouth daily.  Dispense: 90 tablet; Refill: 3  Aim for 2-3 Carb Choices per meal (30-45 grams) +/- 1 either way  Aim for 0-15 Carbs per snack if hungry  Include protein in moderation with your meals and snacks  Consider reading food labels for Total Carbohydrate and Fat Grams of foods  Consider checking BG at alternate times per day  Continue taking medication as directed Fruit Punch - find one with no sugar  Measure and decrease portions of carbohydrate foods  Make your plate and don't go back for seconds   Return in about 3 months (around 03/04/2014), or if symptoms worsen or fail to improve, for Hemoglobin A1C and Follow up, DM, Lipid profile.  The patient  was given clear instructions to go to ER or return to medical center if symptoms don't improve, worsen or new problems develop. The patient verbalized understanding. The patient was told to call to get lab results if they haven't heard anything in the next week.   This note has been created with Surveyor, quantity. Any transcriptional errors are unintentional.    Angelica Chessman, MD, [place], [place], [place] and [place], [place]   12/02/2013, 10:04 AM

## 2013-12-02 NOTE — Progress Notes (Signed)
Patient here for diabetes follow up visit Patient has no complaints and complains of no pain

## 2013-12-02 NOTE — Patient Instructions (Signed)
Diabetes and Exercise Exercising regularly is important. It is not just about losing weight. It has many health benefits, such as:  Improving your overall fitness, flexibility, and endurance.  Increasing your bone density.  Helping with weight control.  Decreasing your body fat.  Increasing your muscle strength.  Reducing stress and tension.  Improving your overall health. People with diabetes who exercise gain additional benefits because exercise:  Reduces appetite.  Improves the body's use of blood sugar (glucose).  Helps lower or control blood glucose.  Decreases blood pressure.  Helps control blood lipids (such as cholesterol and triglycerides).  Improves the body's use of the hormone insulin by:  Increasing the body's insulin sensitivity.  Reducing the body's insulin needs.  Decreases the risk for heart disease because exercising:  Lowers cholesterol and triglycerides levels.  Increases the levels of good cholesterol (such as high-density lipoproteins [HDL]) in the body.  Lowers blood glucose levels. YOUR ACTIVITY PLAN  Choose an activity that you enjoy and set realistic goals. Your health care provider or diabetes educator can help you make an activity plan that works for you. Exercise regularly as directed by your health care provider. This includes:  Performing resistance training twice a week such as push-ups, sit-ups, lifting weights, or using resistance bands.  Performing 150 minutes of cardio exercises each week such as walking, running, or playing sports.  Staying active and spending no more than 90 minutes at one time being inactive. Even short bursts of exercise are good for you. Three 10-minute sessions spread throughout the day are just as beneficial as a single 30-minute session. Some exercise ideas include:  Taking the dog for a walk.  Taking the stairs instead of the elevator.  Dancing to your favorite song.  Doing an exercise  video.  Doing your favorite exercise with a friend. RECOMMENDATIONS FOR EXERCISING WITH TYPE 1 OR TYPE 2 DIABETES   Check your blood glucose before exercising. If blood glucose levels are greater than 240 mg/dL, check for urine ketones. Do not exercise if ketones are present.  Avoid injecting insulin into areas of the body that are going to be exercised. For example, avoid injecting insulin into:  The arms when playing tennis.  The legs when jogging.  Keep a record of:  Food intake before and after you exercise.  Expected peak times of insulin action.  Blood glucose levels before and after you exercise.  The type and amount of exercise you have done.  Review your records with your health care provider. Your health care provider will help you to develop guidelines for adjusting food intake and insulin amounts before and after exercising.  If you take insulin or oral hypoglycemic agents, watch for signs and symptoms of hypoglycemia. They include:  Dizziness.  Shaking.  Sweating.  Chills.  Confusion.  Drink plenty of water while you exercise to prevent dehydration or heat stroke. Body water is lost during exercise and must be replaced.  Talk to your health care provider before starting an exercise program to make sure it is safe for you. Remember, almost any type of activity is better than none. Document Released: 05/06/2003 Document Revised: 06/30/2013 Document Reviewed: 07/23/2012 [employer] Patient Information 2015 [employer]. This information is not intended to replace advice given to you by your health care provider. Make sure you discuss any questions you have with your health care provider. Diabetes Mellitus and Food It is important for you to manage your blood sugar (glucose) level. Your blood glucose level can   be greatly affected by what you eat. Eating healthier foods in the appropriate amounts throughout the day at about the same time each day will help you  control your blood glucose level. It can also help slow or prevent worsening of your diabetes mellitus. Healthy eating may even help you improve the level of your blood pressure and reach or maintain a healthy weight.  HOW CAN FOOD AFFECT ME? Carbohydrates Carbohydrates affect your blood glucose level more than any other type of food. Your dietitian will help you determine how many carbohydrates to eat at each meal and teach you how to count carbohydrates. Counting carbohydrates is important to keep your blood glucose at a healthy level, especially if you are using insulin or taking certain medicines for diabetes mellitus. Alcohol Alcohol can cause sudden decreases in blood glucose (hypoglycemia), especially if you use insulin or take certain medicines for diabetes mellitus. Hypoglycemia can be a life-threatening condition. Symptoms of hypoglycemia (sleepiness, dizziness, and disorientation) are similar to symptoms of having too much alcohol.  If your health care provider has given you approval to drink alcohol, do so in moderation and use the following guidelines:  Women should not have more than one drink per day, and men should not have more than two drinks per day. One drink is equal to:  12 oz of beer.  5 oz of wine.  1 oz of hard liquor.  Do not drink on an empty stomach.  Keep yourself hydrated. Have water, diet soda, or unsweetened iced tea.  Regular soda, juice, and other mixers might contain a lot of carbohydrates and should be counted. WHAT FOODS ARE NOT RECOMMENDED? As you make food choices, it is important to remember that all foods are not the same. Some foods have fewer nutrients per serving than other foods, even though they might have the same number of calories or carbohydrates. It is difficult to get your body what it needs when you eat foods with fewer nutrients. Examples of foods that you should avoid that are high in calories and carbohydrates but low in nutrients  include:  Trans fats (most processed foods list trans fats on the Nutrition Facts label).  Regular soda.  Juice.  Candy.  Sweets, such as cake, pie, doughnuts, and cookies.  Fried foods. WHAT FOODS CAN I EAT? Have nutrient-rich foods, which will nourish your body and keep you healthy. The food you should eat also will depend on several factors, including:  The calories you need.  The medicines you take.  Your weight.  Your blood glucose level.  Your blood pressure level.  Your cholesterol level. You also should eat a variety of foods, including:  Protein, such as meat, poultry, fish, tofu, nuts, and seeds (lean animal proteins are best).  Fruits.  Vegetables.  Dairy products, such as milk, cheese, and yogurt (low fat is best).  Breads, grains, pasta, cereal, rice, and beans.  Fats such as olive oil, trans fat-free margarine, canola oil, avocado, and olives. DOES EVERYONE WITH DIABETES MELLITUS HAVE THE SAME MEAL PLAN? Because every person with diabetes mellitus is different, there is not one meal plan that works for everyone. It is very important that you meet with a dietitian who will help you create a meal plan that is just right for you. Document Released: 11/10/2004 Document Revised: 02/18/2013 Document Reviewed: 01/10/2013 [employer] Patient Information 2015 [employer]. This information is not intended to replace advice given to you by your health care provider. Make sure you discuss any   questions you have with your health care provider.  

## 2014-04-05 IMAGING — CT CT ABD-PELV W/O CM
2 of 4 series · 16 of 46 positions shown, 18 images · non-contrast
Comparison: None.

CLINICAL DATA: Neck pain and right low back pain.

CT ABDOMEN AND PELVIS WITHOUT CONTRAST
TECHNIQUE: Multidetector CT imaging of the abdomen and pelvis was
performed following the standard protocol without intravenous
contrast.

[Series 2: renal stone > 200 lbs 5.0 b31f · axial · 0.73mm/px · z∈[-450,-40]mm · 13 of 90 slices shown, 15 images]
[im 4/90  soft-tissue]
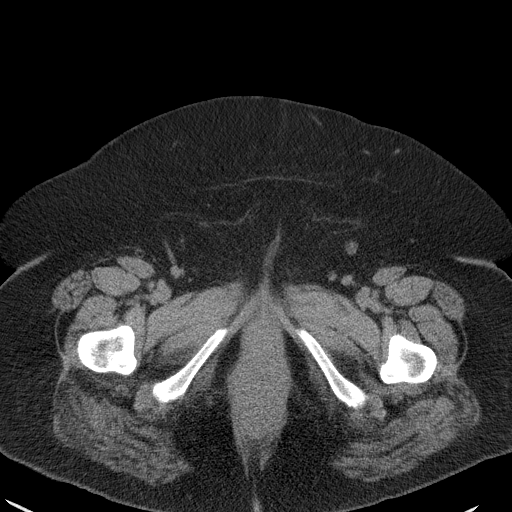
[im 4/90  bone]
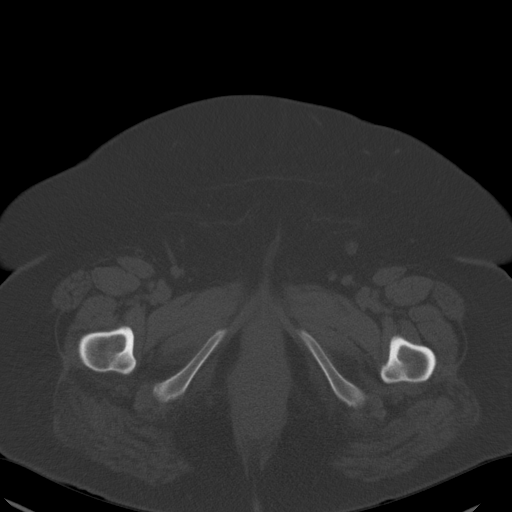
[im 11/90  soft-tissue]
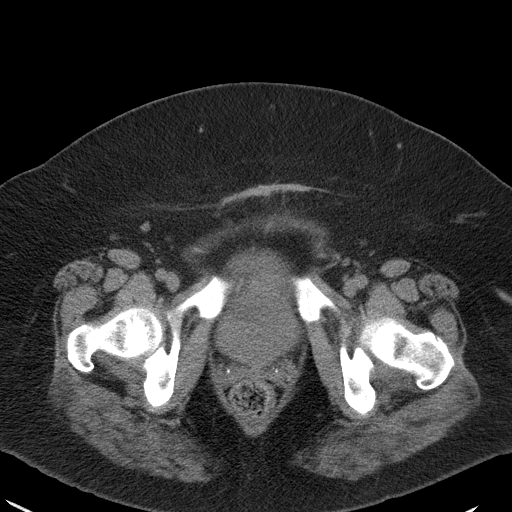
[im 18/90  soft-tissue]
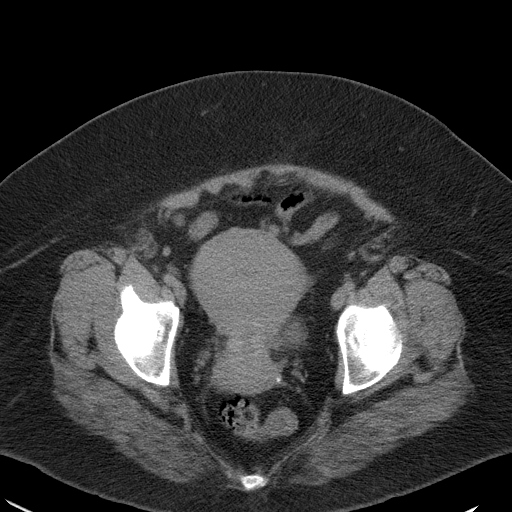
[im 25/90  soft-tissue]
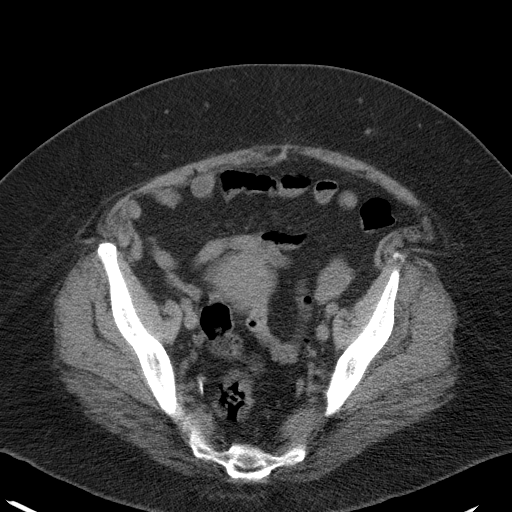
[im 33/90  soft-tissue]
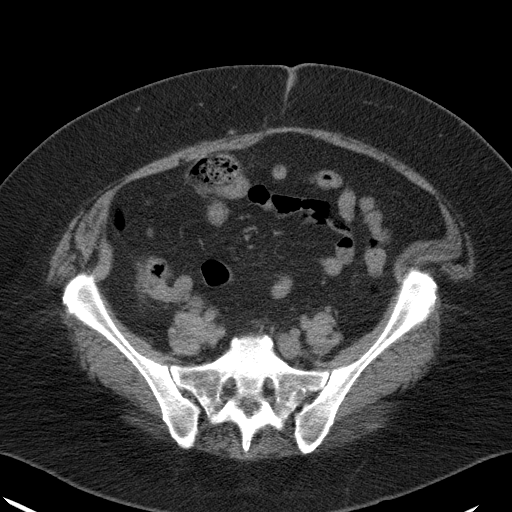
[im 40/90  soft-tissue]
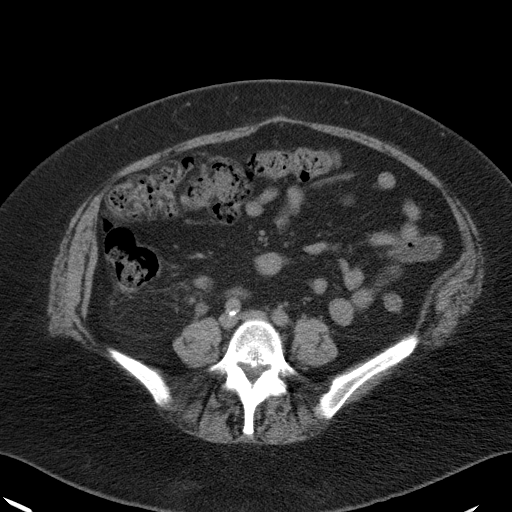
[im 47/90  soft-tissue]
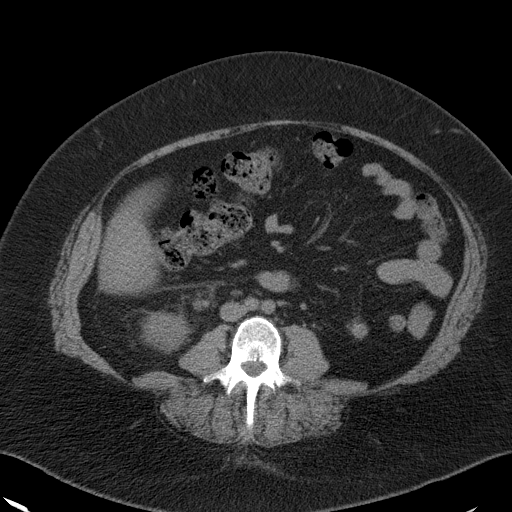
[im 50/90  soft-tissue]
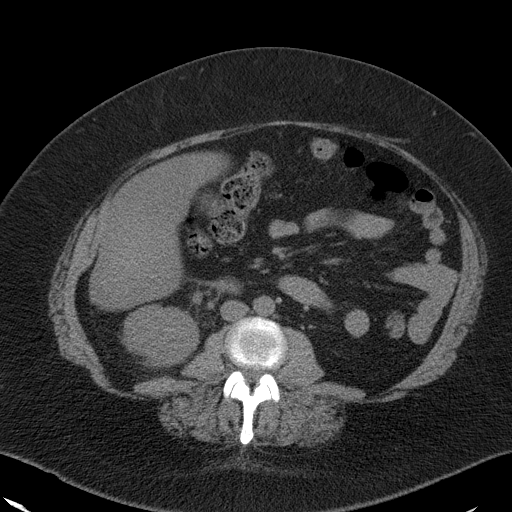
[im 57/90  soft-tissue]
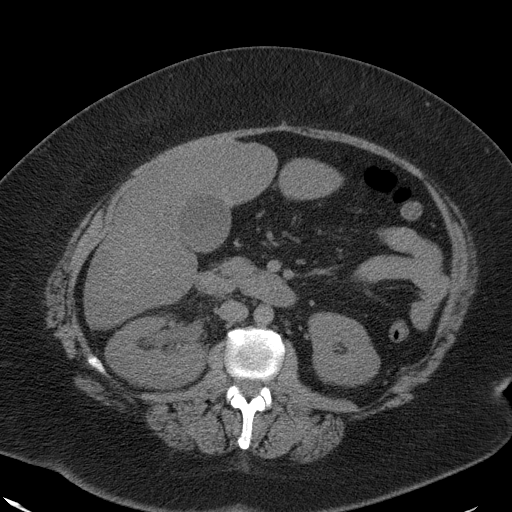
[im 57/90  bone]
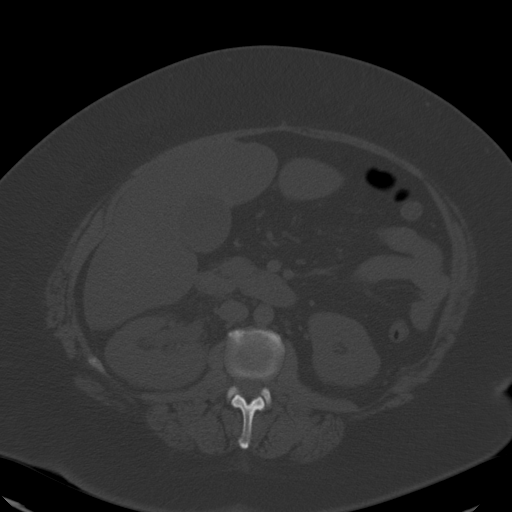
[im 65/90  soft-tissue]
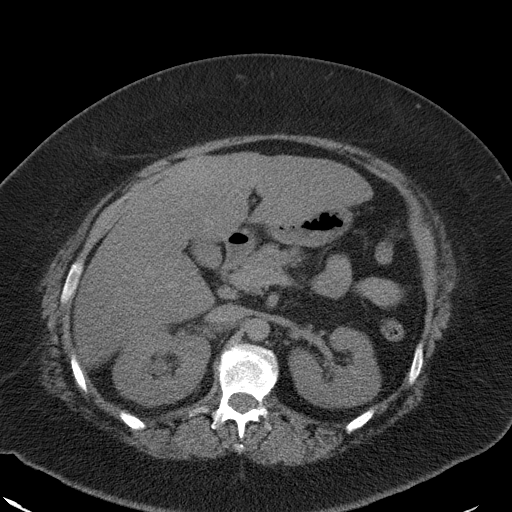
[im 72/90  soft-tissue]
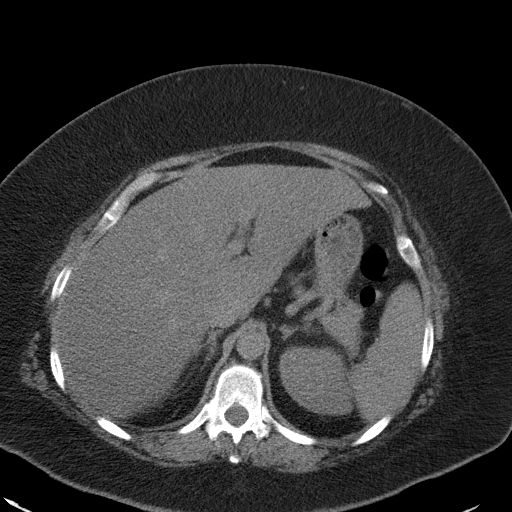
[im 79/90  soft-tissue]
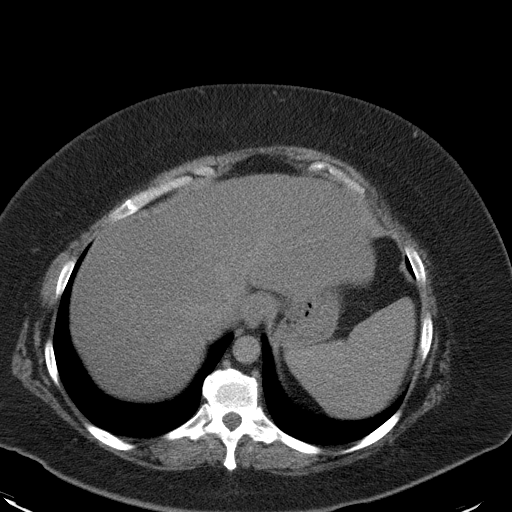
[im 86/90  soft-tissue]
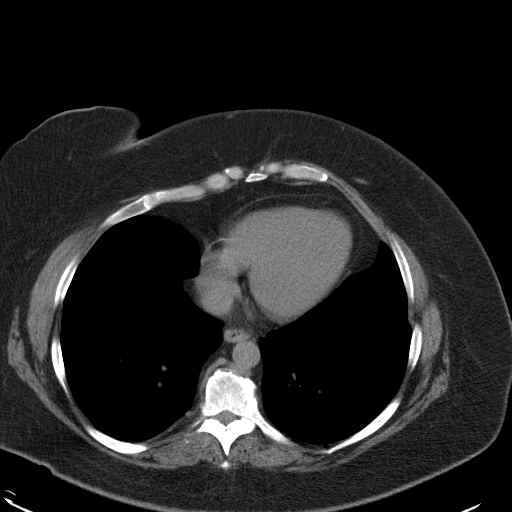

[Series 5: renal stone 3.0 coronal · coronal · 0.88mm/px · 3 of 87 slices shown]
[im 29/87  soft-tissue]
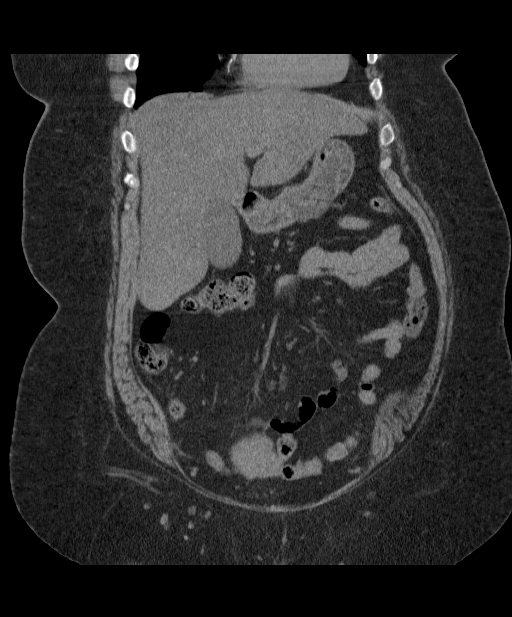
[im 39/87  soft-tissue]
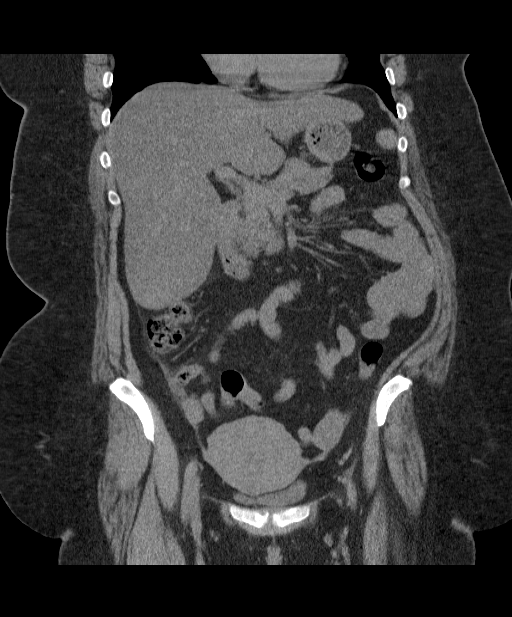
[im 48/87  soft-tissue]
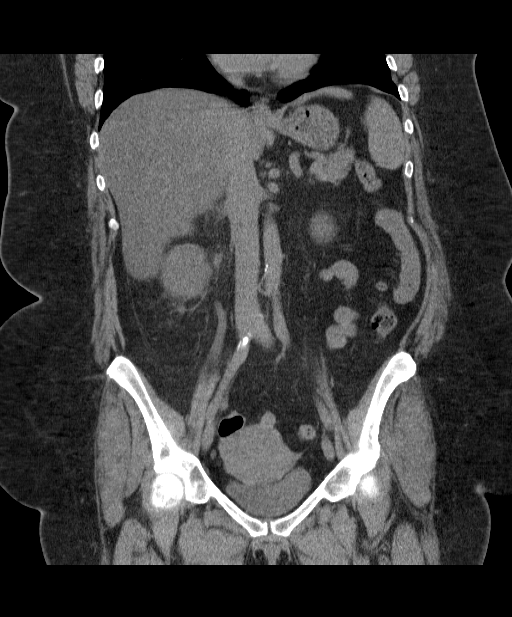

[16 of 46 positions shown; findings below may reference images not displayed]

FINDINGS: Diffuse hepatic steatosis noted.  Two gallstones are
present, larger measuring three gallstones are present, larger
measuring 0.7 cm.

Spleen, pancreas, and adrenal glands normal.  Mild to moderate
right hydronephrosis, right hydroureter, and perirenal and
periureteral stranding noted.  This is due to a 0.6 x 0.3 x 0.3 cm
right UVJ stone.  No additional stones identified.  Urinary bladder
otherwise unremarkable.

No pathologic adenopathy observed.  Appendix normal.  Uterus mildly
prominent with uterine length 12.1 cm, but without a focal mass
identified.  Adnexa unremarkable.

Posterior intervertebral spurring noted at L5-S1 potentially
contributing to mild foraminal stenosis and left subarticular
lateral recess stenosis.
IMPRESSION: 1.  Obstructive 5 x 3 x 3 mm right UVJ calculus with mild to
moderate right hydronephrosis and hydroureter.
2.  Cholelithiasis.
3.  Diffuse hepatic steatosis.
4.  Spurring at L5-S1 causing an indeterminate degree of left
subarticular lateral recess stenosis and mild bilateral foraminal
stenosis.
